# Patient Record
Sex: Male | Born: 1955 | Race: White | Hispanic: No | Marital: Married | Smoking: Former smoker
Health system: Southern US, Community
[De-identification: ages and names within clinical notes are randomized; demographics above are authoritative.]

## PROBLEM LIST (undated history)

## (undated) DIAGNOSIS — E785 Hyperlipidemia, unspecified: Secondary | ICD-10-CM

## (undated) DIAGNOSIS — T7840XA Allergy, unspecified, initial encounter: Secondary | ICD-10-CM

## (undated) DIAGNOSIS — J189 Pneumonia, unspecified organism: Secondary | ICD-10-CM

## (undated) DIAGNOSIS — I10 Essential (primary) hypertension: Secondary | ICD-10-CM

## (undated) DIAGNOSIS — K219 Gastro-esophageal reflux disease without esophagitis: Secondary | ICD-10-CM

## (undated) DIAGNOSIS — M1990 Unspecified osteoarthritis, unspecified site: Secondary | ICD-10-CM

## (undated) HISTORY — PX: BACK SURGERY: SHX140

## (undated) HISTORY — PX: SPINE SURGERY: SHX786

## (undated) HISTORY — DX: Allergy, unspecified, initial encounter: T78.40XA

## (undated) HISTORY — DX: Unspecified osteoarthritis, unspecified site: M19.90

---

## 2003-08-16 ENCOUNTER — Emergency Department (HOSPITAL_COMMUNITY): Admission: EM | Admit: 2003-08-16 | Discharge: 2003-08-17 | Payer: Self-pay | Admitting: Emergency Medicine

## 2003-08-28 ENCOUNTER — Inpatient Hospital Stay (HOSPITAL_COMMUNITY): Admission: RE | Admit: 2003-08-28 | Discharge: 2003-08-31 | Payer: Self-pay | Admitting: Neurosurgery

## 2004-08-29 ENCOUNTER — Emergency Department (HOSPITAL_COMMUNITY): Admission: EM | Admit: 2004-08-29 | Discharge: 2004-08-29 | Payer: Self-pay | Admitting: Emergency Medicine

## 2006-06-17 ENCOUNTER — Emergency Department (HOSPITAL_COMMUNITY): Admission: EM | Admit: 2006-06-17 | Discharge: 2006-06-17 | Payer: Self-pay | Admitting: Family Medicine

## 2009-03-07 ENCOUNTER — Emergency Department (HOSPITAL_COMMUNITY): Admission: EM | Admit: 2009-03-07 | Discharge: 2009-03-07 | Payer: Self-pay | Admitting: Family Medicine

## 2009-04-19 ENCOUNTER — Encounter: Admission: RE | Admit: 2009-04-19 | Discharge: 2009-04-19 | Payer: Self-pay | Admitting: Neurosurgery

## 2010-01-25 ENCOUNTER — Emergency Department (HOSPITAL_BASED_OUTPATIENT_CLINIC_OR_DEPARTMENT_OTHER): Admission: EM | Admit: 2010-01-25 | Discharge: 2010-01-25 | Payer: Self-pay | Admitting: Emergency Medicine

## 2010-07-12 ENCOUNTER — Emergency Department (HOSPITAL_BASED_OUTPATIENT_CLINIC_OR_DEPARTMENT_OTHER): Admission: EM | Admit: 2010-07-12 | Discharge: 2010-07-12 | Payer: Self-pay | Admitting: Emergency Medicine

## 2010-10-22 LAB — COMPREHENSIVE METABOLIC PANEL
ALT: 23 U/L (ref 0–53)
AST: 23 U/L (ref 0–37)
Albumin: 4.3 g/dL (ref 3.5–5.2)
Alkaline Phosphatase: 78 U/L (ref 39–117)
BUN: 16 mg/dL (ref 6–23)
CO2: 23 mEq/L (ref 19–32)
Calcium: 9.1 mg/dL (ref 8.4–10.5)
Chloride: 102 mEq/L (ref 96–112)
Creatinine, Ser: 1 mg/dL (ref 0.4–1.5)
GFR calc Af Amer: >60 mL/min (ref >60)
GFR calc non Af Amer: >60 mL/min (ref >60)
Glucose, Bld: 156 mg/dL — ABNORMAL HIGH (ref 70–99)
Potassium: 3.6 mEq/L (ref 3.5–5.1)
Sodium: 140 mEq/L (ref 135–145)
Total Bilirubin: 0.5 mg/dL (ref 0.3–1.2)
Total Protein: 6.9 g/dL (ref 6.0–8.3)

## 2010-10-22 LAB — CBC
HCT: 43.3 % (ref 39.0–52.0)
Hemoglobin: 14.9 g/dL (ref 13.0–17.0)
MCH: 30 pg (ref 26.0–34.0)
MCHC: 34.3 g/dL (ref 30.0–36.0)
MCV: 87.2 fL (ref 78.0–100.0)
Platelets: 223 10*3/uL (ref 150–400)
RBC: 4.96 MIL/uL (ref 4.22–5.81)
RDW: 12.5 % (ref 11.5–15.5)
WBC: 9.3 10*3/uL (ref 4.0–10.5)

## 2010-10-22 LAB — DIFFERENTIAL
Basophils Absolute: 0.2 10*3/uL — ABNORMAL HIGH (ref 0.0–0.1)
Basophils Relative: 2 % — ABNORMAL HIGH (ref 0–1)
Eosinophils Absolute: 0.2 10*3/uL (ref 0.0–0.7)
Eosinophils Relative: 2 % (ref 0–5)
Lymphocytes Relative: 27 % (ref 12–46)
Lymphs Abs: 2.5 10*3/uL (ref 0.7–4.0)
Monocytes Absolute: 0.5 10*3/uL (ref 0.1–1.0)
Monocytes Relative: 5 % (ref 3–12)
Neutro Abs: 5.9 10*3/uL (ref 1.7–7.7)
Neutrophils Relative %: 64 % (ref 43–77)

## 2010-12-27 NOTE — Op Note (Signed)
NAMEMORAD, TAL                         ACCOUNT NO.:  [id number]   MEDICAL RECORD NO.:  [id number]                   PATIENT TYPE:  INP   LOCATION:  3172                                 FACILITY:  [facility]   PHYSICIAN:  Hewitt Shorts, M.D.            DATE OF BIRTH:  05-17-1956   DATE OF PROCEDURE:  08/28/2003  DATE OF DISCHARGE:                                 OPERATIVE REPORT   PREOPERATIVE DIAGNOSIS:  L3-4 lumbar disk herniation, lumbar spondylosis,  lumbar degenerative disk disease and lumbar radiculopathy.   POSTOPERATIVE DIAGNOSIS:  L3-4 lumbar disk herniation, lumbar spondylosis,  lumbar degenerative disk disease and lumbar radiculopathy.   OPERATION PERFORMED:  L3 Gill procedure, L3-4 posterior lumbar interbody  fusion with Globus PEEK cages with Vitoss with bone marrow aspirate and L3-4  posterolateral arthrodesis with 90D posterior instrumentation and Vitoss  with bone marrow aspirate.   SURGEON:  Hewitt Shorts, M.D.   ASSISTANT:  Cristi Loron, M.D.   ANESTHESIA:  General endotracheal.   INDICATIONS FOR PROCEDURE:  The patient is a 55 year old man who presented  with bilateral lumbar radiculopathy, left worse than right, who had large  circumferential disk protrusion with central disk herniation at the L3-4  level with intractable pain and distal weakness left worse than right.  Decision was made to proceed with decompression and arthrodesis.   DESCRIPTION OF PROCEDURE:  The patient was brought to the operating room and  placed under general endotracheal anesthesia.  Patient was turned to prone  position, lumbar region was prepped with Betadine soap and solution and  draped in sterile fashion.  The midline was infiltrated with local  anesthetic with epinephrine.  An x-ray was taken to localize the L3-4 level  and a midline incision was made and carried down through the subcutaneous  tissue.  Bipolar cautery and electrocautery were used to  maintain  hemostasis.  Dissection was carried down to the lumbar fascia which was  incised bilaterally.  The paraspinal muscles were dissected from the spinous  processes and lamina in subperiosteal fashion.  The L3-4 interlaminar space  and L3 spinous process were identified with another localizing x-ray and  then bilateral L3 laminectomy and facetectomy was performed.  Foraminotomy  was performed for the L4 nerve root bilaterally and the L3 nerve root was  decompressed at it exited into the L3-4 foramen bilaterally.  The ligamentum  flavum had been removed, exposing the thecal sac and nerve roots for the  decompression as described.  We identified the L3-4 disk space which was  incised bilaterally and a thorough diskectomy was performed using a variety  of pituitary rongeurs and microcurets.  The end plates of the vertebral  bodies were cleaned of cartilaginous surfaces and the end plates were  prepared for arthrodesis.  Using various trial spacers, we selected 9 x 8 mm  interbody implant.  We then went ahead and probed the pedicles of  L4 and  aspirated bone marrow aspirate which was injected over 10 mL of Vitoss  strip.  It was allowed to saturate this strip and then the interbody  implants were packed with Vitoss with bone marrow aspirate and then  carefully positioned in the intervertebral disk space and countersunk.  We  then took additional Vitoss with bone marrow aspirate and packed it around  the cages.  It was tamped in position in the intervertebral disk space.  The  C-arm fluoroscopy unit was then brought into the field.  We identified the  pedicle entry sites at L3 and the pedicles were probed.  Each of the four  pedicles was examined with a ball probe.  Note the cut outs were found.  They were all tapped with a 5.25 tap and then a 6.75 x 45 mm screw was  placed at each pedicle.  We then selected a 4 mm rod.  It was positioned  within the screw heads and secured with a locking  cap.  Final tightening was  done __________  We did do general under rod compression bilaterally,  immediately prior to tightening a second locking cap.  The transverse  processes of L3 and L4 had been exposed through an initial exposure.  They  were decorticated and then the additional Vitoss with bone marrow aspirate  was packed over the transverse processes and into the intertransverse  gutter. The wound was irrigated throughout the procedure with saline  solution and bacitracin solution.  A final irrigation was done prior to  closure.  Good hemostasis was established.  We did use the Cell Saver during  the surgery but only 250 mL of blood were lost and was insufficient to  retrieve any from the Cell Saver.  Sponge and instrument counts were  correct.  The wound was closed in multiple layers.  The deep fascia was  closed with interrupted undyed 1 Vicryl suture.  The subcutaneous and  subcuticular layer were closed with interrupted inverted 2-0 undyed Vicryl  sutures and the skin was reapproximated with Dermabond.  The patient  tolerated the procedure well.  Following surgery, he returned back to supine  position to be reversed from anesthetic, extubated and transferred to  recovery room for further care.                                               Hewitt Shorts, M.D.    RWN/MEDQ  D:  08/28/2003  T:  08/28/2003  Job:  [id number]

## 2010-12-27 NOTE — H&P (Signed)
Steven Cook, Steven Cook                         ACCOUNT NO.:  [id number]   MEDICAL RECORD NO.:  [id number]                   PATIENT TYPE:  INP   LOCATION:  2899                                 FACILITY:  [facility]   PHYSICIAN:  Hewitt Shorts, M.D.            DATE OF BIRTH:  1955-09-07   DATE OF ADMISSION:  08/28/2003  DATE OF DISCHARGE:                                HISTORY & PHYSICAL   HISTORY OF PRESENT ILLNESS:  The patient is a 55 year old right-handed white  male who was evaluated for a lumbar disk herniation with resulting lumbar  radiculopathy.  He has had some occasional upper left buttock pain for the  past couple of years and occasionally the left lower extremity would feel  weak.  This past summer he had an episode when he was helping his brother  put up some lighting, he felt a strain in his low back when he extended  backwards.  His most recent episode began 12 days ago without any particular  cause.  He had sudden onset of pain and spasm in the upper left buttock and  across the low back, but the episode was worse than usual.  He did have some  pain in the lateral aspect of the left thigh, but no numbness or  paresthesias.  The patient was treated with a prednisone Dosepak which he  only took for one day because it did not agree with him.  He has been off  from work and resting at home at a sedentary level of activity, and the pain  has eased, but he still is having pain in the upper left buttock and he is  very tentative about his movements.  His wife feels that he significantly is  limiting his activities, but he feels that he is just trying to avoid  aggravating the pain and discomfort.  The patient did not describe any  radicular numbness or paresthesias, he did not describe any specific  weakness.   X-rays and MRI revealed degenerative disk disease and spondylosis of the  lumbar spine, desecration of the disk at L3-4, L4-5, and L5-S1, no  significant finding of  central disk protrusion at L3-4 with circumferential  disk bulging with compression of the ventral aspect of the thecal sac and  bilateral foraminal narrowing due to circumferential disk protrusion.  There  is no disk herniation nor significant neural compression at other levels.   We discussed the options of further care with the patient, including  continued symptomatic treatment versus a more limited surgical procedure  versus a more definitive surgical procedures, presumably an L3 Gill  procedure and a bilateral L3-4 posterior lumbar interbody fusion and  posterior lateral arthrodesis with interbody implants and posterior  instrumentation with bone grafts.  After discussing each of these options  thoroughly, the patient wished to proceed with a more definitive procedure,  and is admitted for such.   PAST MEDICAL  HISTORY:  1. History of gastroesophageal reflux disease for which he takes Prilosec     daily.  2. History of hypercholesterolemia which he is treating with diet     modification.  No history of hypertension, myocardial infarction, cancer,     stroke, diabetes, or lung disease.   PAST SURGICAL HISTORY:  Septoplasty and sinus surgery.   ALLERGIES:  He reports an allergy to ASPIRIN which causes watery eyes and a  runny nose.   CURRENT MEDICATIONS:  Prilosec.   FAMILY HISTORY:  His father died at age 89 of a brain tumor.  Mother is in  good health at age 14 with heart disease.  There is a family history of  hypertension, diabetes, and back surgery.   SOCIAL HISTORY:  The patient is married.  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  He smokes 1/2 pack a day, he has been  smoking for nearly 30 years.  He does not drink alcoholic beverages.  He  denies history of substance abuse.   REVIEW OF SYSTEMS:  Notable for those symptoms described in the history of  present illness and past medical history, otherwise unremarkable.   PHYSICAL  EXAMINATION:  GENERAL:  The patient is a well-developed, well-  nourished white male in no acute distress, but he is clearly very tenative  in his movements to avoid exacerbating his discomfort.  VITAL SIGNS:  Temperature 97.6, pulse 102, blood pressure 136/94,  respiratory rate 18, height 5 feet 9 inches, weight 173 pounds.  LUNGS:  Clear to auscultation.  He has symmetrical respiratory excursion.  HEART:  Regular rate and rhythm with S1 and S2.  There is no murmur.  ABDOMEN:  Soft, nontender, bowel sounds are present.  EXTREMITIES:  Mild to moderate clubbing.  There is no cyanosis or edema.  MUSCULOSKELETAL:  Mild tenderness to palpation of the lumbar spine.  There  is point tenderness over the lumbar spinous process.  Mobility is  significantly limited.  He is limited in forward flexion to about 30 degrees  due to pain.  He is limited in extension as well.  Straight leg raising is  positive on the left to about 20 or 30 degrees, and he has positive cross  straight leg raise on the right at about 20 to 30 degrees with pain  occurring on the left side.  NEUROLOGIC:  5/5 strength in the iliopsoas, quadriceps, dorsiflexion, and  plantar flexion.  However, the left extensor hallicis longus is 4+/5 and the  right extensor hallicis longus is 4+ to 5/5.  Sensation is intact to  pinprick through the distal lower extremities.  Reflexes are 2 at the  biceps, 1 to 2 ___________, symmetrical bilaterally.  Toes are downgoing  bilaterally.  His gait and stance are both somewhat tentative due to  attempting to avoid re-exacerbating his radicular pain.   IMPRESSION:  Disabling left lumbar radiculopathy secondary to a L3-4 disk  herniation superimposed upon underlying degenerative disk disease and  spondylosis with bilateral mild extensor hallices longus weakness, left  worse than right.   PLAN:  The patient will be admitted for a L3 Gill procedure with bilateral L3-4 posterior lumbar interbody fusion  with interbody implants and posterior  lateral arthrodesis with posterior instrumentation and bone graft.   We did discuss alternatives to surgery and alternative surgical procedures.  We discussed the nature of each of these procedures.  We discussed the  ____________, limitations postoperatively, need for postoperative  immobilization and lumbar corset, the plan to use a Cell Saver during such a  surgery, and risks of surgery, including risk of infection, bleeding,  possible need for transfusion, risk of nerve root dysfunction with pain,  numbness, weakness, or paresthesias, the risk of dural tear and CSF leakage,  the possibility for further surgery, the risk of failure of the arthrodesis,  particularly in light of his smoking history, possible need for further  surgery, and anesthetic risks of myocardial infarction, stroke, pneumonia,  and death, all of which again are increased due to both his smoking as well  as hypercholesterolemia.   I did speak with the patient and his wife at length about the importance of  him quitting smoking to enhance the likelihood of a successful fusion and  successful outcome to surgery.  Understanding all of this, the patient is  admitted for such.                                                Hewitt Shorts, M.D.    RWN/MEDQ  D:  08/28/2003  T:  08/28/2003  Job:  [id number]

## 2010-12-27 NOTE — Discharge Summary (Signed)
[name], Steven Cook                         ACCOUNT NO.:  [id number]   MEDICAL RECORD NO.:  [id number]                   PATIENT TYPE:  INP   LOCATION:  3020                                 FACILITY:  [facility]   PHYSICIAN:  Hewitt Shorts, M.D.            DATE OF BIRTH:  10-17-1955   DATE OF ADMISSION:  08/28/2003  DATE OF DISCHARGE:  08/31/2003                                 DISCHARGE SUMMARY   HISTORY AND PHYSICAL:  Patient is a 55 year old man who presented with acute  lumbar radiculopathy secondary to lumbar disk herniation.  He had a central  disk herniation at the L3-4 level with disk protrusion laterally within the  foramen bilaterally as well.   PHYSICAL EXAMINATION:  GENERAL:  Unremarkable.  NEUROLOGIC:  Weakness of the extensor hallucis longus.   HOSPITAL COURSE:  Patient was admitted.  Underwent an L3 Gill procedure and  L3-4 posterior lumbar interbody fusion and posterolateral arthrodesis with  interbody cages and posterior instrumentation and bone graft.  He has done  well following surgery.  His wound is healing well.  He is afebrile.  He is  up and ambulating.  He is being discharged to home with instructions  regarding wound care and activities.   A discharge prescription was given for Percocet 1-2 tablets every 4-6 hours  p.r.n. pain with 50 tablets prescribed.  No refills.   DISCHARGE DIAGNOSES:  1. Lumbar disk herniation.  2. Lumbar spondylosis.  3. Lumbar degenerative disk disease.  4. Lumbar radiculopathy.                                                Hewitt Shorts, M.D.    RWN/MEDQ  D:  08/31/2003  T:  09/01/2003  Job:  [id number]

## 2011-03-03 ENCOUNTER — Other Ambulatory Visit (HOSPITAL_COMMUNITY): Payer: Self-pay | Admitting: Neurosurgery

## 2011-03-03 DIAGNOSIS — M542 Cervicalgia: Secondary | ICD-10-CM

## 2011-03-26 ENCOUNTER — Ambulatory Visit (HOSPITAL_COMMUNITY)
Admission: RE | Admit: 2011-03-26 | Discharge: 2011-03-26 | Disposition: A | Discharge status: Home / Self Care | Payer: BC Managed Care – PPO | Source: Ambulatory Visit | Attending: Neurosurgery | Admitting: Neurosurgery

## 2011-03-26 DIAGNOSIS — M47812 Spondylosis without myelopathy or radiculopathy, cervical region: Secondary | ICD-10-CM | POA: Insufficient documentation

## 2011-03-26 DIAGNOSIS — M542 Cervicalgia: Secondary | ICD-10-CM

## 2011-04-10 ENCOUNTER — Other Ambulatory Visit: Payer: Self-pay | Admitting: Neurosurgery

## 2011-04-10 DIAGNOSIS — M479 Spondylosis, unspecified: Secondary | ICD-10-CM

## 2011-04-10 DIAGNOSIS — M5020 Other cervical disc displacement, unspecified cervical region: Secondary | ICD-10-CM

## 2011-04-10 DIAGNOSIS — IMO0002 Reserved for concepts with insufficient information to code with codable children: Secondary | ICD-10-CM

## 2011-04-10 DIAGNOSIS — M542 Cervicalgia: Secondary | ICD-10-CM

## 2011-04-14 ENCOUNTER — Encounter: Payer: Self-pay | Admitting: *Deleted

## 2011-04-14 ENCOUNTER — Emergency Department (INDEPENDENT_AMBULATORY_CARE_PROVIDER_SITE_OTHER): Payer: BC Managed Care – PPO

## 2011-04-14 ENCOUNTER — Emergency Department (HOSPITAL_BASED_OUTPATIENT_CLINIC_OR_DEPARTMENT_OTHER)
Admission: EM | Admit: 2011-04-14 | Discharge: 2011-04-14 | Disposition: A | Discharge status: Home / Self Care | Payer: BC Managed Care – PPO | Attending: Emergency Medicine | Admitting: Emergency Medicine

## 2011-04-14 DIAGNOSIS — J3489 Other specified disorders of nose and nasal sinuses: Secondary | ICD-10-CM | POA: Insufficient documentation

## 2011-04-14 DIAGNOSIS — R0989 Other specified symptoms and signs involving the circulatory and respiratory systems: Secondary | ICD-10-CM

## 2011-04-14 DIAGNOSIS — J40 Bronchitis, not specified as acute or chronic: Secondary | ICD-10-CM | POA: Insufficient documentation

## 2011-04-14 DIAGNOSIS — R059 Cough, unspecified: Secondary | ICD-10-CM | POA: Insufficient documentation

## 2011-04-14 DIAGNOSIS — J329 Chronic sinusitis, unspecified: Secondary | ICD-10-CM | POA: Insufficient documentation

## 2011-04-14 DIAGNOSIS — R05 Cough: Secondary | ICD-10-CM

## 2011-04-14 MED ORDER — AMOXICILLIN 500 MG PO CAPS: 500.0000 mg | ORAL_CAPSULE | Freq: Three times a day (TID) | ORAL | Status: AC

## 2011-04-14 NOTE — ED Notes (Signed)
Pt. Just returned from x-ray 

## 2011-04-14 NOTE — ED Notes (Signed)
Family at bedside. 

## 2011-04-14 NOTE — ED Notes (Signed)
Pt. Reports pain all over and generalized weakness with cough and stuffy nose.

## 2011-04-14 NOTE — ED Provider Notes (Signed)
History     CSN: [id number] Arrival date & time: 04/14/2011  1:19 PM  Chief Complaint  Patient presents with  . Sinusitis   Patient is a 55 y.o. male presenting with sinusitis. The history is provided by the patient. No language interpreter was used.  Sinusitis  This is a new problem. The current episode started more than 2 days ago. The problem has not changed since onset.There has been no fever. The pain is moderate. The pain has been constant since onset. Associated symptoms include sinus pressure and cough. Pertinent negatives include no chills and no sore throat. Treatments tried: otc cold medications. The treatment provided no relief.    History reviewed. No pertinent past medical history.  Past Surgical History  Procedure Date  . Back surgery     No family history on file.  History  Substance Use Topics  . Smoking status: Former Games developer  . Smokeless tobacco: Current User  . Alcohol Use: No      Review of Systems  Constitutional: Negative for chills.  HENT: Positive for sinus pressure. Negative for sore throat.   Respiratory: Positive for cough.   All other systems reviewed and are negative.    Physical Exam  BP 128/86  Pulse 90  Temp(Src) 98.3 F (36.8 C) (Oral)  Resp 20  SpO2 97%  Physical Exam  Nursing note and vitals reviewed. Constitutional: He is oriented to person, place, and time. He appears well-developed and well-nourished.  HENT:  Head: Normocephalic.  Right Ear: External ear normal.  Left Ear: External ear normal.  Nose: Rhinorrhea present. Right sinus exhibits frontal sinus tenderness. Left sinus exhibits frontal sinus tenderness.  Cardiovascular: Normal rate and regular rhythm.   Pulmonary/Chest: Breath sounds normal.  Abdominal: Soft. Bowel sounds are normal.  Musculoskeletal: Normal range of motion.  Neurological: He is alert and oriented to person, place, and time.  Skin: Skin is warm and dry.    ED Course  Procedures  Dg Chest 2  View  04/14/2011  *RADIOLOGY REPORT*  Clinical Data: 55 year old male with cough and congestion.  CHEST - 2 VIEW  Comparison: None  Findings: The cardiomediastinal silhouette is unremarkable. Mild peribronchial thickening is present. There is no evidence of focal airspace disease, pulmonary edema, pulmonary nodule/mass, pleural effusion, or pneumothorax. No acute bony abnormalities are identified.  IMPRESSION: Mild peribronchial thickening without focal pneumonia.  This may be a reflection of bronchitis or asthma.  Original Report Authenticated By: Rosendo Gros, M.D.   r   MDM Will treat for sinusitis and bronchitis      Teressa Lower, NP 04/14/11 1633  Teressa Lower, NP 04/14/11 [id number]

## 2011-04-14 NOTE — ED Notes (Signed)
Pt. In no distress and has been reassured of the wait for EDP.  Pt. Complaining and states "each time I come here I am seen with in 5 mins." Girlfriend of Pt. Very angry and states she has to be in bed by 5pm.  Explained to Pt. And girlfriend that we are working as hard and as fast as we can.  Offered any needs and neither of them have any.

## 2011-04-14 NOTE — ED Provider Notes (Signed)
Medical screening examination/treatment/procedure(s) were performed by non-physician practitioner and as supervising physician I was immediately available for consultation/collaboration.   Geoffery Lyons, MD 04/14/11 2139

## 2011-04-15 ENCOUNTER — Inpatient Hospital Stay: Admission: RE | Admit: 2011-04-15 | Payer: BC Managed Care – PPO | Source: Ambulatory Visit

## 2011-04-18 ENCOUNTER — Other Ambulatory Visit: Payer: Self-pay | Admitting: Neurosurgery

## 2011-04-18 ENCOUNTER — Ambulatory Visit
Admission: RE | Admit: 2011-04-18 | Discharge: 2011-04-18 | Disposition: A | Discharge status: Home / Self Care | Payer: BC Managed Care – PPO | Source: Ambulatory Visit | Attending: Neurosurgery | Admitting: Neurosurgery

## 2011-04-18 DIAGNOSIS — M542 Cervicalgia: Secondary | ICD-10-CM

## 2011-04-18 DIAGNOSIS — M479 Spondylosis, unspecified: Secondary | ICD-10-CM

## 2011-04-18 DIAGNOSIS — M5020 Other cervical disc displacement, unspecified cervical region: Secondary | ICD-10-CM

## 2011-04-18 DIAGNOSIS — IMO0002 Reserved for concepts with insufficient information to code with codable children: Secondary | ICD-10-CM

## 2011-04-18 MED ORDER — IOHEXOL 300 MG/ML  SOLN
1.0000 mL | Freq: Once | INTRAMUSCULAR | Status: AC | PRN
  Administered 2011-04-18: 1 mL via INTRA_ARTICULAR

## 2011-04-18 MED ORDER — DEXAMETHASONE SODIUM PHOSPHATE 4 MG/ML IJ SOLN
0.3000 mg | Freq: Once | INTRAMUSCULAR | Status: AC
  Administered 2011-04-18: 0.32 mg via INTRAVENOUS

## 2012-01-19 ENCOUNTER — Emergency Department (HOSPITAL_BASED_OUTPATIENT_CLINIC_OR_DEPARTMENT_OTHER)
Admission: EM | Admit: 2012-01-19 | Discharge: 2012-01-19 | Disposition: A | Discharge status: Home / Self Care | Payer: BC Managed Care – PPO | Attending: Emergency Medicine | Admitting: Emergency Medicine

## 2012-01-19 ENCOUNTER — Encounter (HOSPITAL_BASED_OUTPATIENT_CLINIC_OR_DEPARTMENT_OTHER): Payer: Self-pay | Admitting: *Deleted

## 2012-01-19 DIAGNOSIS — I10 Essential (primary) hypertension: Secondary | ICD-10-CM | POA: Insufficient documentation

## 2012-01-19 DIAGNOSIS — K1379 Other lesions of oral mucosa: Secondary | ICD-10-CM

## 2012-01-19 DIAGNOSIS — Z87891 Personal history of nicotine dependence: Secondary | ICD-10-CM | POA: Insufficient documentation

## 2012-01-19 DIAGNOSIS — K122 Cellulitis and abscess of mouth: Secondary | ICD-10-CM | POA: Insufficient documentation

## 2012-01-19 HISTORY — DX: Essential (primary) hypertension: I10

## 2012-01-19 MED ORDER — FAMOTIDINE 20 MG PO TABS: 20.0000 mg | ORAL_TABLET | Freq: Two times a day (BID) | ORAL | Status: DC

## 2012-01-19 MED ORDER — DIPHENHYDRAMINE HCL 25 MG PO CAPS
25.0000 mg | ORAL_CAPSULE | Freq: Once | ORAL | Status: AC
  Administered 2012-01-19: 25 mg via ORAL
  Filled 2012-01-19: qty 1

## 2012-01-19 MED ORDER — DEXAMETHASONE SODIUM PHOSPHATE 10 MG/ML IJ SOLN
10.0000 mg | Freq: Once | INTRAMUSCULAR | Status: AC
  Administered 2012-01-19: 10 mg via INTRAMUSCULAR
  Filled 2012-01-19: qty 1

## 2012-01-19 MED ORDER — FAMOTIDINE 20 MG PO TABS
20.0000 mg | ORAL_TABLET | Freq: Once | ORAL | Status: AC
  Administered 2012-01-19: 20 mg via ORAL
  Filled 2012-01-19: qty 1

## 2012-01-19 NOTE — ED Notes (Signed)
Pt amb to room 8 with quick steady gait in nad. Pt states he awakened this am to feeling as if his uvula is swollen. Pt states "it is irritating". Pt denies any sob, rash, sore throat, fevers or any other c/o.

## 2012-01-19 NOTE — ED Provider Notes (Signed)
History     CSN: [id number]  Arrival date & time 01/19/12  6962   First MD Initiated Contact with Patient 01/19/12 [phone]      Chief Complaint  Patient presents with  . Oral Swelling    (Consider location/radiation/quality/duration/timing/severity/associated sxs/prior treatment) HPI Comments: Patient presents with complaint of uvula swelling since this morning.  He states he woke up this morning he can feel that it felt slightly irritated.  Over the course of the morning he is felt that it's enlarged to the point that it's pain down into his throat and causing him more irritation and difficulty.  He can control his secretions.  He can speak and has no shortness of breath.  He denies any other rash or pruritic symptoms.  He denies any new medications or foods.  Patient does note he's had similar symptoms in the past but has not been able to determine a cause.  Patient otherwise feels completely well and has no URI symptoms either.  The history is provided by the patient.    Past Medical History  Diagnosis Date  . Hypertension     Past Surgical History  Procedure Date  . Back surgery     History reviewed. No pertinent family history.  History  Substance Use Topics  . Smoking status: Former Games developer  . Smokeless tobacco: Current User  . Alcohol Use: No      Review of Systems  Constitutional: Negative.  Negative for fever and chills.  Eyes: Negative.   Respiratory: Negative.  Negative for cough and shortness of breath.   Cardiovascular: Negative.  Negative for chest pain.  Gastrointestinal: Negative.  Negative for nausea, vomiting and abdominal pain.  Genitourinary: Negative.   Musculoskeletal: Negative.  Negative for back pain.  Skin: Negative.  Negative for color change and rash.  Hematological: Negative.  Negative for adenopathy.  Psychiatric/Behavioral: Negative.  Negative for confusion.  All other systems reviewed and are negative.    Allergies  Nsaids  Home  Medications   Current Outpatient Rx  Name Route Sig Dispense Refill  . ESOMEPRAZOLE MAGNESIUM 20 MG PO CPDR Oral Take 20 mg by mouth daily before breakfast.    . LISINOPRIL-HYDROCHLOROTHIAZIDE 10-12.5 MG PO TABS Oral Take 1 tablet by mouth daily.    Marland Kitchen SIMVASTATIN 20 MG PO TABS Oral Take 20 mg by mouth every evening.    Marland Kitchen FAMOTIDINE 20 MG PO TABS Oral Take 1 tablet (20 mg total) by mouth 2 (two) times daily. 20 tablet 0    BP 139/82  Pulse 80  Temp(Src) 98 F (36.7 C) (Oral)  Resp 18  SpO2 98%  Physical Exam  Nursing note and vitals reviewed. Constitutional: He is oriented to person, place, and time. He appears well-developed and well-nourished.  Non-toxic appearance. He does not have a sickly appearance.  HENT:  Head: Normocephalic and atraumatic.  Mouth/Throat: No oropharyngeal exudate.       Patient does have some mild uvular swelling on exam.  No exudates present.  Uvula is midline.  No peritonsillar swelling or erythema is noted.  No sublingual swelling.  No cervical adenopathy.  No submandibular lymphadenopathy.  Eyes: Conjunctivae, EOM and lids are normal. Pupils are equal, round, and reactive to light.  Neck: Trachea normal, normal range of motion and full passive range of motion without pain. Neck supple.  Cardiovascular: Normal rate, regular rhythm and normal heart sounds.   Pulmonary/Chest: Effort normal and breath sounds normal. No respiratory distress.  Abdominal: Normal appearance. There is  no CVA tenderness.  Musculoskeletal: Normal range of motion.  Lymphadenopathy:    He has no cervical adenopathy.  Neurological: He is alert and oriented to person, place, and time. He has normal strength.  Skin: Skin is warm, dry and intact. No rash noted.  Psychiatric: He has a normal mood and affect. His behavior is normal. Judgment and thought content normal.    ED Course  Procedures (including critical care time)  Labs Reviewed - No data to display No results  found.   1. Uvular edema       MDM  Patient presents with possible mild allergic reaction.  There is no clear etiologies the patient denies any new medications or foods.  Patient is in no respiratory distress and is able to speak without difficulty.  He is no difficulty controlling his secretions.  I will treat the patient for allergic reaction with Benadryl, Pepcid and Decadron.  Patient has been advised that should the symptoms worsen or not improve he should be reevaluated.  He desires to go back to work today and does not feel this is worsening rapidly to warrant further observation in the emergency department at this time.        Nat Christen, MD 01/19/12 [phone]

## 2012-01-19 NOTE — Discharge Instructions (Signed)
Please continue to take Benadryl for the next 3-4 days until this improves.  Continues to take Pepcid twice a day as well.  Please return to the emergency department if you have increased difficulty breathing or feel that there is increased swelling in your uvula or mouth.  Allergic Reaction, Mild to Moderate Allergies may happen from anything your body is sensitive to. This may be food, medications, pollens, chemicals, and nearly anything around you in everyday life that produces allergens. An allergen is anything that causes an allergy producing substance. Allergens cause your body to release allergic antibodies. Through a chain of events, they cause a release of histamine into the blood stream. Histamines are meant to protect you, but they also cause your discomfort. This is why antihistamines are often used for allergies. Heredity is often a factor in causing allergic reactions. This means you may have some of the same allergies as your parents. Allergies happen in all age groups. You may have some idea of what caused your reaction. There are many allergens around [place]. It may be difficult to know what caused your reaction. If this is a first time event, it may never happen again. Allergies cannot be cured but can be controlled with medications. SYMPTOMS  You may get some or all of the following problems from allergies.  Swelling and itching in and around the mouth.   Tearing, itchy eyes.   Nasal congestion and runny nose.   Sneezing and coughing.   An itchy red rash or hives.   Vomiting or diarrhea.   Difficulty breathing.  Seasonal allergies occur in all age groups. They are seasonal because they usually occur during the same season every year. They may be a reaction to molds, grass pollens, or tree pollens. Other causes of allergies are house dust mite allergens, pet dander and mold spores. These are just a common few of the thousands of allergens around [place]. All of the symptoms listed above  happen when you come in contact with pollens and other allergens. Seasonal allergies are usually not life threatening. They are generally more of a nuisance that can often be handled using medications. Hay fever is a combination of all or some of the above listed allergy problems. It may often be treated with simple over-the-counter medications such as diphenhydramine. Take medication as directed. Check with your caregiver or package insert for child dosages. TREATMENT AND HOME CARE INSTRUCTIONS If hives or rash are present:  Take medications as directed.   You may use an over-the-counter antihistamine (diphenhydramine) for hives and itching as needed. Do not drive or drink alcohol until medications used to treat the reaction have worn off. Antihistamines tend to make people sleepy.   Apply cold cloths (compresses) to the skin or take baths in cool water. This will help itching. Avoid hot baths or showers. Heat will make a rash and itching worse.   If your allergies persist and become more severe, and over the counter medications are not effective, there are many new medications your caretaker can prescribe. Immunotherapy or desensitizing injections can be used if all else fails. Follow up with your caregiver if problems continue.  SEEK MEDICAL CARE IF:   Your allergies are becoming progressively more troublesome.   You suspect a food allergy. Symptoms generally happen within 30 minutes of eating a food.   Your symptoms have not gone away within 2 days or are getting worse.   You develop new symptoms.   You want to retest yourself or  your child with a food or drink you think causes an allergic reaction. Never test yourself or your child of a suspected allergy without being under the watchful eye of your caregivers. A second exposure to an allergen may be life-threatening.  SEEK IMMEDIATE MEDICAL CARE IF:  You develop difficulty breathing or wheezing, or have a tight feeling in your chest or  throat.   You develop a swollen mouth, hives, swelling, or itching all over your body.  A severe reaction with any of the above problems should be considered life-threatening. If you suddenly develop difficulty breathing call for local emergency medical help. THIS IS AN EMERGENCY. MAKE SURE YOU:   Understand these instructions.   Will watch your condition.   Will get help right away if you are not doing well or get worse.  Document Released: 05/25/2007 Document Revised: 07/17/2011 Document Reviewed: 05/25/2007 [facility] Patient Information 2012 [place], [place].

## 2012-07-18 ENCOUNTER — Emergency Department (HOSPITAL_BASED_OUTPATIENT_CLINIC_OR_DEPARTMENT_OTHER)
Admission: EM | Admit: 2012-07-18 | Discharge: 2012-07-18 | Disposition: A | Discharge status: Home / Self Care | Payer: BC Managed Care – PPO | Attending: Emergency Medicine | Admitting: Emergency Medicine

## 2012-07-18 ENCOUNTER — Emergency Department (HOSPITAL_BASED_OUTPATIENT_CLINIC_OR_DEPARTMENT_OTHER): Payer: BC Managed Care – PPO

## 2012-07-18 ENCOUNTER — Encounter (HOSPITAL_BASED_OUTPATIENT_CLINIC_OR_DEPARTMENT_OTHER): Payer: Self-pay

## 2012-07-18 DIAGNOSIS — Z8701 Personal history of pneumonia (recurrent): Secondary | ICD-10-CM | POA: Insufficient documentation

## 2012-07-18 DIAGNOSIS — J069 Acute upper respiratory infection, unspecified: Secondary | ICD-10-CM | POA: Insufficient documentation

## 2012-07-18 DIAGNOSIS — E785 Hyperlipidemia, unspecified: Secondary | ICD-10-CM | POA: Insufficient documentation

## 2012-07-18 DIAGNOSIS — Z79899 Other long term (current) drug therapy: Secondary | ICD-10-CM | POA: Insufficient documentation

## 2012-07-18 DIAGNOSIS — F17200 Nicotine dependence, unspecified, uncomplicated: Secondary | ICD-10-CM | POA: Insufficient documentation

## 2012-07-18 DIAGNOSIS — R0602 Shortness of breath: Secondary | ICD-10-CM | POA: Insufficient documentation

## 2012-07-18 DIAGNOSIS — K219 Gastro-esophageal reflux disease without esophagitis: Secondary | ICD-10-CM | POA: Insufficient documentation

## 2012-07-18 DIAGNOSIS — J40 Bronchitis, not specified as acute or chronic: Secondary | ICD-10-CM | POA: Insufficient documentation

## 2012-07-18 DIAGNOSIS — I10 Essential (primary) hypertension: Secondary | ICD-10-CM | POA: Insufficient documentation

## 2012-07-18 DIAGNOSIS — R062 Wheezing: Secondary | ICD-10-CM | POA: Insufficient documentation

## 2012-07-18 HISTORY — DX: Gastro-esophageal reflux disease without esophagitis: K21.9

## 2012-07-18 HISTORY — DX: Hyperlipidemia, unspecified: E78.5

## 2012-07-18 HISTORY — DX: Pneumonia, unspecified organism: J18.9

## 2012-07-18 MED ORDER — IPRATROPIUM BROMIDE 0.02 % IN SOLN
0.5000 mg | Freq: Once | RESPIRATORY_TRACT | Status: AC
  Administered 2012-07-18: 0.5 mg via RESPIRATORY_TRACT

## 2012-07-18 MED ORDER — IPRATROPIUM BROMIDE 0.02 % IN SOLN
RESPIRATORY_TRACT | Status: AC
  Administered 2012-07-18: 0.5 mg via RESPIRATORY_TRACT
  Filled 2012-07-18: qty 2.5

## 2012-07-18 MED ORDER — AZITHROMYCIN 250 MG PO TABS: 250.0000 mg | ORAL_TABLET | Freq: Every day | ORAL | Status: DC

## 2012-07-18 MED ORDER — ALBUTEROL SULFATE (5 MG/ML) 0.5% IN NEBU
INHALATION_SOLUTION | RESPIRATORY_TRACT | Status: AC
  Administered 2012-07-18: 5 mg via RESPIRATORY_TRACT
  Filled 2012-07-18: qty 1

## 2012-07-18 MED ORDER — ALBUTEROL SULFATE HFA 108 (90 BASE) MCG/ACT IN AERS: 1.0000 | INHALATION_SPRAY | Freq: Four times a day (QID) | RESPIRATORY_TRACT | Status: DC | PRN

## 2012-07-18 MED ORDER — ALBUTEROL SULFATE (5 MG/ML) 0.5% IN NEBU
5.0000 mg | INHALATION_SOLUTION | Freq: Once | RESPIRATORY_TRACT | Status: AC
  Administered 2012-07-18: 5 mg via RESPIRATORY_TRACT

## 2012-07-18 NOTE — ED Provider Notes (Signed)
History     CSN: [id number]  Arrival date & time 07/18/12  1201   First MD Initiated Contact with Patient 07/18/12 1259      Chief Complaint  Patient presents with  . Cough  . URI    (Consider location/radiation/quality/duration/timing/severity/associated sxs/prior treatment) Patient is a 56 y.o. male presenting with cough and URI. The history is provided by the patient. No language interpreter was used.  Cough This is a new problem. The current episode started yesterday (5 days ago). The problem occurs constantly. The problem has been gradually worsening. The cough is non-productive. There has been no fever. Associated symptoms include shortness of breath and wheezing. He has tried nothing for the symptoms. The treatment provided no relief. He is a smoker. His past medical history is significant for pneumonia.  URI The primary symptoms include cough and wheezing.   Pt reports he has been coughing up colored phelgm Past Medical History  Diagnosis Date  . Hypertension   . GERD (gastroesophageal reflux disease)   . Hyperlipemia   . Pneumonia     Past Surgical History  Procedure Date  . Back surgery     No family history on file.  History  Substance Use Topics  . Smoking status: Current Every Day Smoker -- 0.5 packs/day    Types: Cigarettes  . Smokeless tobacco: Current User  . Alcohol Use: Yes     Comment: occasional      Review of Systems  Respiratory: Positive for cough, shortness of breath and wheezing.   All other systems reviewed and are negative.    Allergies  Nsaids  Home Medications   Current Outpatient Rx  Name  Route  Sig  Dispense  Refill  . ESOMEPRAZOLE MAGNESIUM 20 MG PO CPDR   Oral   Take 20 mg by mouth daily before breakfast.         . FAMOTIDINE 20 MG PO TABS   Oral   Take 1 tablet (20 mg total) by mouth 2 (two) times daily.   20 tablet   0   . LISINOPRIL-HYDROCHLOROTHIAZIDE 10-12.5 MG PO TABS   Oral   Take 1 tablet by mouth  daily.         Marland Kitchen SIMVASTATIN 20 MG PO TABS   Oral   Take 20 mg by mouth every evening.           BP 119/78  Pulse 84  Temp 98.8 F (37.1 C) (Oral)  Resp 22  SpO2 96%  Physical Exam  Nursing note and vitals reviewed. Constitutional: He appears well-developed and well-nourished.  HENT:  Head: Normocephalic.  Right Ear: External ear normal.  Left Ear: External ear normal.  Nose: Nose normal.  Mouth/Throat: Oropharynx is clear and moist.  Eyes: Conjunctivae normal and EOM are normal. Pupils are equal, round, and reactive to light.  Neck: Normal range of motion.  Cardiovascular: Normal rate and normal heart sounds.   Pulmonary/Chest: Effort normal.       Rhonchi all lobes  Abdominal: Soft.  Musculoskeletal: Normal range of motion.  Neurological: He is alert.  Skin: Skin is warm.  Psychiatric: He has a normal mood and affect.    ED Course  Procedures (including critical care time)  Labs Reviewed - No data to display Dg Chest 2 View  07/18/2012  *RADIOLOGY REPORT*  Clinical Data: Cough and congestion.  CHEST - 2 VIEW  Comparison: Two-view chest 04/14/2011.  Findings: The heart size is normal.  Mild interstitial coarsening is chronic.  No focal airspace disease is evident.  Minimal degenerative changes in the thoracic spine are stable.  IMPRESSION:  1.  No acute cardiopulmonary disease or significant interval change. 2.  Stable chronic interstitial coarsening.   Original Report Authenticated By: Marin Roberts, M.D.      No diagnosis found.    MDM  Pt given albuterol neb.   Rx for albuterol and zithromax.   Pt advised to stop smoking.   I reviewed chest xray with pt and advised him of changes due to smoking       Lonia Skinner [place], [place] 07/18/12 [phone]

## 2012-07-18 NOTE — ED Notes (Signed)
Patient transported to X-ray 

## 2012-07-18 NOTE — ED Notes (Signed)
Pt returned from radiology.

## 2012-07-18 NOTE — ED Notes (Signed)
Pt reports coughing and congestion that started Wednesday.  He has been taking OTC medications without relief.

## 2012-07-18 NOTE — ED Provider Notes (Signed)
History/physical exam/procedure(s) were performed by non-physician practitioner and as supervising physician I was immediately available for consultation/collaboration. I have reviewed all notes and am in agreement with care and plan.   Hilario Quarry, MD 07/18/12 [phone]

## 2012-10-05 ENCOUNTER — Emergency Department (HOSPITAL_BASED_OUTPATIENT_CLINIC_OR_DEPARTMENT_OTHER)
Admission: EM | Admit: 2012-10-05 | Discharge: 2012-10-05 | Disposition: A | Discharge status: Home / Self Care | Payer: BC Managed Care – PPO | Attending: Emergency Medicine | Admitting: Emergency Medicine

## 2012-10-05 ENCOUNTER — Encounter (HOSPITAL_BASED_OUTPATIENT_CLINIC_OR_DEPARTMENT_OTHER): Payer: Self-pay | Admitting: *Deleted

## 2012-10-05 DIAGNOSIS — K219 Gastro-esophageal reflux disease without esophagitis: Secondary | ICD-10-CM | POA: Insufficient documentation

## 2012-10-05 DIAGNOSIS — I10 Essential (primary) hypertension: Secondary | ICD-10-CM | POA: Insufficient documentation

## 2012-10-05 DIAGNOSIS — F17200 Nicotine dependence, unspecified, uncomplicated: Secondary | ICD-10-CM | POA: Insufficient documentation

## 2012-10-05 DIAGNOSIS — R197 Diarrhea, unspecified: Secondary | ICD-10-CM | POA: Insufficient documentation

## 2012-10-05 DIAGNOSIS — K5289 Other specified noninfective gastroenteritis and colitis: Secondary | ICD-10-CM | POA: Insufficient documentation

## 2012-10-05 DIAGNOSIS — R066 Hiccough: Secondary | ICD-10-CM | POA: Insufficient documentation

## 2012-10-05 DIAGNOSIS — Z79899 Other long term (current) drug therapy: Secondary | ICD-10-CM | POA: Insufficient documentation

## 2012-10-05 DIAGNOSIS — E785 Hyperlipidemia, unspecified: Secondary | ICD-10-CM | POA: Insufficient documentation

## 2012-10-05 DIAGNOSIS — Z8701 Personal history of pneumonia (recurrent): Secondary | ICD-10-CM | POA: Insufficient documentation

## 2012-10-05 DIAGNOSIS — K529 Noninfective gastroenteritis and colitis, unspecified: Secondary | ICD-10-CM

## 2012-10-05 MED ORDER — PANTOPRAZOLE SODIUM 40 MG IV SOLR
40.0000 mg | Freq: Once | INTRAVENOUS | Status: AC
  Administered 2012-10-05: 40 mg via INTRAVENOUS
  Filled 2012-10-05: qty 40

## 2012-10-05 MED ORDER — SODIUM CHLORIDE 0.9 % IV BOLUS (SEPSIS)
1000.0000 mL | Freq: Once | INTRAVENOUS | Status: AC
  Administered 2012-10-05: 1000 mL via INTRAVENOUS

## 2012-10-05 MED ORDER — FENTANYL CITRATE 0.05 MG/ML IJ SOLN
50.0000 ug | Freq: Once | INTRAMUSCULAR | Status: AC
  Administered 2012-10-05: 50 ug via INTRAVENOUS
  Filled 2012-10-05: qty 2

## 2012-10-05 MED ORDER — FAMOTIDINE IN NACL 20-0.9 MG/50ML-% IV SOLN
20.0000 mg | Freq: Once | INTRAVENOUS | Status: AC
  Administered 2012-10-05: 20 mg via INTRAVENOUS
  Filled 2012-10-05: qty 50

## 2012-10-05 MED ORDER — ONDANSETRON 8 MG PO TBDP: 8.0000 mg | ORAL_TABLET | Freq: Three times a day (TID) | ORAL | Status: DC | PRN

## 2012-10-05 MED ORDER — PROMETHAZINE HCL 25 MG/ML IJ SOLN
25.0000 mg | Freq: Once | INTRAMUSCULAR | Status: AC
  Administered 2012-10-05: 25 mg via INTRAVENOUS
  Filled 2012-10-05: qty 1

## 2012-10-05 NOTE — ED Notes (Signed)
Pt lying supine with mask in place, reports his wife has been sick with a gi virus, "and I got it yesterday". Pt reports numerous episodes of diarrhea and vomiting x yesterday afternoon around 4pm. Pt states he has burning in his epigastric area "all the way up to my throat from throwing up..." .

## 2012-10-05 NOTE — ED Provider Notes (Signed)
History     CSN: [id number]  Arrival date & time 10/05/12  0921   First MD Initiated Contact with Patient 10/05/12 1020      Chief Complaint  Patient presents with  . Nausea, Vomiting and Diarrhea     (Consider location/radiation/quality/duration/timing/severity/associated sxs/prior treatment) HPI This is a 57 year old male with nausea, vomiting and diarrhea since yesterday afternoon. He states his wife had a similar illness recently. Symptoms are moderate to severe. The vomiting is resulted in turning in his epigastric region radiating up to his throat; he describes this as feeling like severe acid reflux. He has also been having hiccups. He does not feel lightheaded nor does he have a dry mouth. There no specific mitigating or exacerbating factors. He denies abdominal pain apart from the burning.  Past Medical History  Diagnosis Date  . Hypertension   . GERD (gastroesophageal reflux disease)   . Hyperlipemia   . Pneumonia     Past Surgical History  Procedure Laterality Date  . Back surgery      History reviewed. No pertinent family history.  History  Substance Use Topics  . Smoking status: Current Every Day Smoker -- 0.50 packs/day    Types: Cigarettes  . Smokeless tobacco: Current User  . Alcohol Use: Yes     Comment: occasional      Review of Systems  All other systems reviewed and are negative.    Allergies  Nsaids  Home Medications   Current Outpatient Rx  Name  Route  Sig  Dispense  Refill  . omeprazole (PRILOSEC) 40 MG capsule   Oral   Take 40 mg by mouth daily.         Marland Kitchen albuterol (PROVENTIL HFA;VENTOLIN HFA) 108 (90 BASE) MCG/ACT inhaler   Inhalation   Inhale 1-2 puffs into the lungs every 6 (six) hours as needed for wheezing.   1 Inhaler   0   . azithromycin (ZITHROMAX) 250 MG tablet   Oral   Take 1 tablet (250 mg total) by mouth daily. Take first 2 tablets together, then 1 every day until finished.   6 tablet   0   . esomeprazole  (NEXIUM) 20 MG capsule   Oral   Take 20 mg by mouth daily before breakfast.         . famotidine (PEPCID) 20 MG tablet   Oral   Take 1 tablet (20 mg total) by mouth 2 (two) times daily.   20 tablet   0   . lisinopril-hydrochlorothiazide (PRINZIDE,ZESTORETIC) 10-12.5 MG per tablet   Oral   Take 1 tablet by mouth daily.         . simvastatin (ZOCOR) 20 MG tablet   Oral   Take 20 mg by mouth every evening.           BP 147/79  Pulse 81  Temp(Src) 97.9 F (36.6 C) (Oral)  Resp 18  Ht 5\' 10"  (1.778 m)  Wt 180 lb (81.647 kg)  BMI 25.83 kg/m2  SpO2 100%  Physical Exam General: Well-developed, well-nourished male in no acute distress; appearance consistent with age of record HENT: normocephalic, atraumatic; no pharyngeal erythema or exudate Eyes: pupils equal round and reactive to light; extraocular muscles intact Neck: supple Heart: regular rate and rhythm Lungs: clear to auscultation bilaterally Abdomen: soft; nondistended; nontender; no masses or hepatosplenomegaly; bowel sounds present Extremities: No deformity; full range of motion Neurologic: Awake, alert and oriented; motor function intact in all extremities and symmetric; no facial droop Skin:  Warm and dry Psychiatric: Flat affect    ED Course  Procedures (including critical care time)    MDM  12:32 PM Feels much better after IV fluids and IV medications. His nausea is controlled. His acid reflux is controlled. His hiccups are controlled. The Phenergan made him fairly somnolent and he requests Zofran prescription instead.        Hanley Seamen, MD 10/05/12 1233

## 2013-09-27 ENCOUNTER — Encounter: Payer: BC Managed Care – PPO | Admitting: Sports Medicine

## 2013-10-03 ENCOUNTER — Emergency Department (HOSPITAL_BASED_OUTPATIENT_CLINIC_OR_DEPARTMENT_OTHER): Payer: BC Managed Care – PPO

## 2013-10-03 ENCOUNTER — Emergency Department (HOSPITAL_BASED_OUTPATIENT_CLINIC_OR_DEPARTMENT_OTHER)
Admission: EM | Admit: 2013-10-03 | Discharge: 2013-10-03 | Disposition: A | Discharge status: Home / Self Care | Payer: BC Managed Care – PPO | Attending: Emergency Medicine | Admitting: Emergency Medicine

## 2013-10-03 ENCOUNTER — Encounter (HOSPITAL_BASED_OUTPATIENT_CLINIC_OR_DEPARTMENT_OTHER): Payer: Self-pay | Admitting: Emergency Medicine

## 2013-10-03 DIAGNOSIS — K219 Gastro-esophageal reflux disease without esophagitis: Secondary | ICD-10-CM | POA: Insufficient documentation

## 2013-10-03 DIAGNOSIS — Z79899 Other long term (current) drug therapy: Secondary | ICD-10-CM | POA: Insufficient documentation

## 2013-10-03 DIAGNOSIS — I10 Essential (primary) hypertension: Secondary | ICD-10-CM | POA: Insufficient documentation

## 2013-10-03 DIAGNOSIS — Z8701 Personal history of pneumonia (recurrent): Secondary | ICD-10-CM | POA: Insufficient documentation

## 2013-10-03 DIAGNOSIS — E785 Hyperlipidemia, unspecified: Secondary | ICD-10-CM | POA: Insufficient documentation

## 2013-10-03 DIAGNOSIS — Z792 Long term (current) use of antibiotics: Secondary | ICD-10-CM | POA: Insufficient documentation

## 2013-10-03 DIAGNOSIS — K2970 Gastritis, unspecified, without bleeding: Secondary | ICD-10-CM | POA: Insufficient documentation

## 2013-10-03 DIAGNOSIS — K2990 Gastroduodenitis, unspecified, without bleeding: Principal | ICD-10-CM

## 2013-10-03 DIAGNOSIS — F17200 Nicotine dependence, unspecified, uncomplicated: Secondary | ICD-10-CM | POA: Insufficient documentation

## 2013-10-03 LAB — COMPREHENSIVE METABOLIC PANEL
ALT: 16 U/L (ref 0–53)
AST: 14 U/L (ref 0–37)
Albumin: 3.8 g/dL (ref 3.5–5.2)
Alkaline Phosphatase: 72 U/L (ref 39–117)
BUN: 30 mg/dL — ABNORMAL HIGH (ref 6–23)
CO2: 24 mEq/L (ref 19–32)
Calcium: 9.6 mg/dL (ref 8.4–10.5)
Chloride: 100 mEq/L (ref 96–112)
Creatinine, Ser: 1 mg/dL (ref 0.50–1.35)
GFR calc Af Amer: >90 mL/min (ref >90)
GFR calc non Af Amer: 82 mL/min — ABNORMAL LOW (ref >90)
Glucose, Bld: 117 mg/dL — ABNORMAL HIGH (ref 70–99)
Potassium: 4.5 mEq/L (ref 3.7–5.3)
Sodium: 138 mEq/L (ref 137–147)
Total Bilirubin: 0.6 mg/dL (ref 0.3–1.2)
Total Protein: 6.9 g/dL (ref 6.0–8.3)

## 2013-10-03 LAB — CBC WITH DIFFERENTIAL/PLATELET
Basophils Absolute: 0 10*3/uL (ref 0.0–0.1)
Basophils Relative: 0 % (ref 0–1)
Eosinophils Absolute: 0.1 10*3/uL (ref 0.0–0.7)
Eosinophils Relative: 0 % (ref 0–5)
HCT: 45.4 % (ref 39.0–52.0)
Hemoglobin: 15.3 g/dL (ref 13.0–17.0)
Lymphocytes Relative: 12 % (ref 12–46)
Lymphs Abs: 1.7 10*3/uL (ref 0.7–4.0)
MCH: 29.5 pg (ref 26.0–34.0)
MCHC: 33.7 g/dL (ref 30.0–36.0)
MCV: 87.6 fL (ref 78.0–100.0)
Monocytes Absolute: 1.3 10*3/uL — ABNORMAL HIGH (ref 0.1–1.0)
Monocytes Relative: 9 % (ref 3–12)
Neutro Abs: 10.9 10*3/uL — ABNORMAL HIGH (ref 1.7–7.7)
Neutrophils Relative %: 79 % — ABNORMAL HIGH (ref 43–77)
Platelets: 239 10*3/uL (ref 150–400)
RBC: 5.18 MIL/uL (ref 4.22–5.81)
RDW: 13.7 % (ref 11.5–15.5)
WBC: 14 10*3/uL — ABNORMAL HIGH (ref 4.0–10.5)

## 2013-10-03 LAB — TROPONIN I: Troponin I: <0.3 ng/mL (ref <0.30)

## 2013-10-03 LAB — LIPASE, BLOOD: Lipase: 27 U/L (ref 11–59)

## 2013-10-03 MED ORDER — SODIUM CHLORIDE 0.9 % IV BOLUS (SEPSIS)
1000.0000 mL | Freq: Once | INTRAVENOUS | Status: AC
  Administered 2013-10-03: 1000 mL via INTRAVENOUS

## 2013-10-03 MED ORDER — PANTOPRAZOLE SODIUM 20 MG PO TBEC: 20.0000 mg | DELAYED_RELEASE_TABLET | Freq: Every day | ORAL | Status: DC

## 2013-10-03 MED ORDER — ONDANSETRON HCL 4 MG/2ML IJ SOLN
4.0000 mg | Freq: Once | INTRAMUSCULAR | Status: AC
  Administered 2013-10-03: 4 mg via INTRAVENOUS
  Filled 2013-10-03: qty 2

## 2013-10-03 MED ORDER — PROMETHAZINE HCL 25 MG/ML IJ SOLN
12.5000 mg | Freq: Once | INTRAMUSCULAR | Status: DC
  Filled 2013-10-03: qty 1

## 2013-10-03 MED ORDER — FAMOTIDINE IN NACL 20-0.9 MG/50ML-% IV SOLN
20.0000 mg | Freq: Once | INTRAVENOUS | Status: DC
  Filled 2013-10-03: qty 50

## 2013-10-03 MED ORDER — PANTOPRAZOLE SODIUM 40 MG IV SOLR
40.0000 mg | Freq: Once | INTRAVENOUS | Status: AC
  Administered 2013-10-03: 40 mg via INTRAVENOUS
  Filled 2013-10-03: qty 40

## 2013-10-03 MED ORDER — GI COCKTAIL ~~LOC~~
30.0000 mL | Freq: Once | ORAL | Status: AC
  Administered 2013-10-03: 30 mL via ORAL
  Filled 2013-10-03: qty 30

## 2013-10-03 MED ORDER — ONDANSETRON 4 MG PO TBDP: 4.0000 mg | ORAL_TABLET | Freq: Three times a day (TID) | ORAL | Status: DC | PRN

## 2013-10-03 NOTE — ED Provider Notes (Signed)
CSN: [id number]     Arrival date & time 10/03/13  0917 History   First MD Initiated Contact with Patient 10/03/13 [phone]     Chief Complaint  Patient presents with  . diarrhea  vomiting abdominal pain from reflux      (Consider location/radiation/quality/duration/timing/severity/associated sxs/prior Treatment) HPI Comments: Pt state that he started having diarrhea yesterday and then when he woke up this morning he had an emesis that was black in color. Pt state that he has a history of this and it is from his reflux. Pt states that he also started having epigastric pain which is similar to when his reflux acts up. Pt has been taking the omeprazole:denies fever or abdominal pain  The history is provided by the patient. No language interpreter was used.    Past Medical History  Diagnosis Date  . Hypertension   . GERD (gastroesophageal reflux disease)   . Hyperlipemia   . Pneumonia    Past Surgical History  Procedure Laterality Date  . Back surgery     History reviewed. No pertinent family history. History  Substance Use Topics  . Smoking status: Current Every Day Smoker -- 0.50 packs/day    Types: Cigarettes  . Smokeless tobacco: Current User  . Alcohol Use: Yes     Comment: occasional    Review of Systems  Constitutional: Negative.   Respiratory: Negative.   Cardiovascular: Negative.       Allergies  Nsaids  Home Medications   Current Outpatient Rx  Name  Route  Sig  Dispense  Refill  . ondansetron (ZOFRAN ODT) 8 MG disintegrating tablet   Oral   Take 1 tablet (8 mg total) by mouth every 8 (eight) hours as needed for nausea.   10 tablet   0   . albuterol (PROVENTIL HFA;VENTOLIN HFA) 108 (90 BASE) MCG/ACT inhaler   Inhalation   Inhale 1-2 puffs into the lungs every 6 (six) hours as needed for wheezing.   1 Inhaler   0   . azithromycin (ZITHROMAX) 250 MG tablet   Oral   Take 1 tablet (250 mg total) by mouth daily. Take first 2 tablets together, then 1  every day until finished.   6 tablet   0   . esomeprazole (NEXIUM) 20 MG capsule   Oral   Take 20 mg by mouth daily before breakfast.         . EXPIRED: famotidine (PEPCID) 20 MG tablet   Oral   Take 1 tablet (20 mg total) by mouth 2 (two) times daily.   20 tablet   0   . lisinopril-hydrochlorothiazide (PRINZIDE,ZESTORETIC) 10-12.5 MG per tablet   Oral   Take 1 tablet by mouth daily.         Marland Kitchen omeprazole (PRILOSEC) 40 MG capsule   Oral   Take 40 mg by mouth daily.         . simvastatin (ZOCOR) 20 MG tablet   Oral   Take 20 mg by mouth every evening.          BP 138/82  Pulse 94  Temp(Src) 98.3 F (36.8 C) (Oral)  Resp 18  SpO2 97% Physical Exam  Nursing note and vitals reviewed. Constitutional: He is oriented to person, place, and time. He appears well-developed and well-nourished.  Cardiovascular: Normal rate and regular rhythm.   Pulmonary/Chest: Effort normal and breath sounds normal.  Abdominal: There is tenderness in the epigastric area.  Musculoskeletal: Normal range of motion.  Neurological: He is alert  and oriented to person, place, and time.  Skin: Skin is warm and dry.  Psychiatric: He has a normal mood and affect.    ED Course  Procedures (including critical care time) Labs Review Labs Reviewed  COMPREHENSIVE METABOLIC PANEL - Abnormal; Notable for the following:    Glucose, Bld 117 (*)    BUN 30 (*)    GFR calc non Af Amer 82 (*)    All other components within normal limits  CBC WITH DIFFERENTIAL - Abnormal; Notable for the following:    WBC 14.0 (*)    Neutrophils Relative % 79 (*)    Neutro Abs 10.9 (*)    Monocytes Absolute 1.3 (*)    All other components within normal limits  LIPASE, BLOOD  TROPONIN I   Imaging Review No results found.  EKG Interpretation    Date/Time:  Monday October 03 2013 09:37:07 EST Ventricular Rate:  88 PR Interval:  142 QRS Duration: 78 QT Interval:  352 QTC Calculation: 425 R Axis:   77 Text  Interpretation:  Normal sinus rhythm Normal ECG No old tracing to compare Confirmed by WARD  DO, KRISTEN ([id number]) on 10/03/2013 9:37:52 AM            MDM   Final diagnoses:  Gastritis  GERD (gastroesophageal reflux disease)    Pt symptoms resolved after the gi cocktail. Pt is tolerating po without any problem. Likely partially viral and gerd:pt given gi referral:pt didn't vomiting here so unable to check for blood. hgb within normal limits    Teressa LowerVrinda Amarien Carne, NP 10/03/13 1336

## 2013-10-03 NOTE — ED Provider Notes (Signed)
Medical screening examination/treatment/procedure(s) were performed by non-physician practitioner and as supervising physician I was immediately available for consultation/collaboration.  EKG Interpretation    Date/Time:  Monday October 03 2013 09:37:07 EST Ventricular Rate:  88 PR Interval:  142 QRS Duration: 78 QT Interval:  352 QTC Calculation: 425 R Axis:   77 Text Interpretation:  Normal sinus rhythm Normal ECG No old tracing to compare Confirmed by WARD  DO, KRISTEN ([id number]) on 10/03/2013 9:37:52 AM              Layla MawKristen N Ward, DO 10/03/13 [phone]

## 2013-10-03 NOTE — ED Notes (Signed)
Onset last night states he was having acid reflux and a lot of belching progressed to diarrhea vomited x 1 states emesis was black and has had that before in the past.

## 2013-10-03 NOTE — Discharge Instructions (Signed)
Diet for Gastroesophageal Reflux Disease, Adult  Reflux is when stomach acid flows up into the esophagus. The esophagus becomes irritated and sore (inflammation). When reflux happens often and is severe, it is called gastroesophageal reflux disease (GERD). What you eat can help ease any discomfort caused by GERD.  FOODS OR DRINKS TO AVOID OR LIMIT   Coffee and black tea, with or without caffeine.   Bubbly (carbonated) drinks with caffeine or energy drinks.   Strong spices, such as pepper, cayenne pepper, curry, or chili powder.   Peppermint or spearmint.   Chocolate.   High-fat foods, such as meats, fried food, oils, butter, or nuts.   Fruits and vegetables that cause discomfort. This includes citrus fruits and tomatoes.   Alcohol.  If a certain food or drink irritates your GERD, avoid eating or drinking it.  THINGS THAT MAY HELP GERD INCLUDE:   Eat meals slowly.   Eat 5 to 6 small meals a day, not 3 large meals.   Do not eat food for a certain amount of time if it causes discomfort.   Wait 3 hours after eating before lying down.   Keep the head of your bed raised 6 to 9 inches (15 23 centimeters). Put a foam wedge or blocks under the legs of the bed.   Stay active. Weight loss, if needed, may help ease your discomfort.   Wear loose-fitting clothing.   Do not smoke or chew tobacco.  Document Released: 01/27/2012 Document Reviewed: 01/27/2012  [employer] Patient Information 2014 [employer].

## 2013-10-03 NOTE — ED Notes (Signed)
Patient transported to X-ray 

## 2013-10-03 NOTE — ED Notes (Signed)
Pt refused the meds stating the burping episodes have passed. Spouse at the bedside is concerned that "we need to find out what is causing all of this if everything is normal"

## 2015-03-16 ENCOUNTER — Encounter (HOSPITAL_BASED_OUTPATIENT_CLINIC_OR_DEPARTMENT_OTHER): Payer: Self-pay

## 2015-03-16 ENCOUNTER — Emergency Department (HOSPITAL_BASED_OUTPATIENT_CLINIC_OR_DEPARTMENT_OTHER)
Admission: EM | Admit: 2015-03-16 | Discharge: 2015-03-16 | Disposition: A | Discharge status: Home / Self Care | Payer: BLUE CROSS/BLUE SHIELD | Attending: Emergency Medicine | Admitting: Emergency Medicine

## 2015-03-16 DIAGNOSIS — I10 Essential (primary) hypertension: Secondary | ICD-10-CM | POA: Insufficient documentation

## 2015-03-16 DIAGNOSIS — E785 Hyperlipidemia, unspecified: Secondary | ICD-10-CM | POA: Diagnosis not present

## 2015-03-16 DIAGNOSIS — Z720 Tobacco use: Secondary | ICD-10-CM | POA: Insufficient documentation

## 2015-03-16 DIAGNOSIS — Z8701 Personal history of pneumonia (recurrent): Secondary | ICD-10-CM | POA: Insufficient documentation

## 2015-03-16 DIAGNOSIS — Z79899 Other long term (current) drug therapy: Secondary | ICD-10-CM | POA: Insufficient documentation

## 2015-03-16 DIAGNOSIS — Z9889 Other specified postprocedural states: Secondary | ICD-10-CM | POA: Diagnosis not present

## 2015-03-16 DIAGNOSIS — K219 Gastro-esophageal reflux disease without esophagitis: Secondary | ICD-10-CM | POA: Insufficient documentation

## 2015-03-16 DIAGNOSIS — M545 Low back pain, unspecified: Secondary | ICD-10-CM

## 2015-03-16 MED ORDER — TRAMADOL HCL 50 MG PO TABS: 50.0000 mg | ORAL_TABLET | Freq: Four times a day (QID) | ORAL | Status: DC | PRN

## 2015-03-16 MED ORDER — DEXAMETHASONE SODIUM PHOSPHATE 10 MG/ML IJ SOLN
10.0000 mg | Freq: Once | INTRAMUSCULAR | Status: AC
  Administered 2015-03-16: 10 mg via INTRAMUSCULAR
  Filled 2015-03-16: qty 1

## 2015-03-16 MED ORDER — METHOCARBAMOL 500 MG PO TABS: 500.0000 mg | ORAL_TABLET | Freq: Two times a day (BID) | ORAL | Status: DC

## 2015-03-16 NOTE — ED Provider Notes (Signed)
CSN: [id number]     Arrival date & time 03/16/15  1317 History   First MD Initiated Contact with Patient 03/16/15 1329     Chief Complaint  Patient presents with  . Back Pain     (Consider location/radiation/quality/duration/timing/severity/associated sxs/prior Treatment) HPI Comments: Patient with a history of hypertension and hyperlipidemia presents with back pain. He states he had back surgery about 10-11 years ago by Dr. Newell Coral but has not had any problems since that time. He states that 4 days ago he was lifting a lawnmower and had a sudden onset of pulling pain in his right lower back. He states the pain got better yesterday but then got worse again today. He states the pain in his right lower back and radiates to his right groin. He denies any radiation down his leg. There is no numbness or weakness in his leg. He's having intermittent muscle spasms in his right back. He denies abdominal pain. There is no dizziness. No numbness or weakness to his extremities. No problems with bowel or bladder control.  Patient is a 59 y.o. male presenting with back pain.  Back Pain Associated symptoms: no abdominal pain, no chest pain, no fever, no headaches, no numbness and no weakness     Past Medical History  Diagnosis Date  . Hypertension   . GERD (gastroesophageal reflux disease)   . Hyperlipemia   . Pneumonia    Past Surgical History  Procedure Laterality Date  . Back surgery     History reviewed. No pertinent family history. History  Substance Use Topics  . Smoking status: Current Every Day Smoker -- 0.50 packs/day    Types: Cigarettes  . Smokeless tobacco: Current User  . Alcohol Use: Yes     Comment: occasional    Review of Systems  Constitutional: Negative for fever, chills, diaphoresis and fatigue.  HENT: Negative for congestion, rhinorrhea and sneezing.   Eyes: Negative.   Respiratory: Negative for cough, chest tightness and shortness of breath.   Cardiovascular:  Negative for chest pain and leg swelling.  Gastrointestinal: Negative for nausea, vomiting, abdominal pain, diarrhea and blood in stool.  Genitourinary: Negative for frequency, hematuria, flank pain and difficulty urinating.  Musculoskeletal: Positive for back pain. Negative for arthralgias.  Skin: Negative for rash.  Neurological: Negative for dizziness, speech difficulty, weakness, numbness and headaches.      Allergies  Nsaids  Home Medications   Prior to Admission medications   Medication Sig Start Date End Date Taking? Authorizing Provider  Fenofibrate (TRICOR PO) Take by mouth.   Yes Historical Provider, MD  lisinopril-hydrochlorothiazide (PRINZIDE,ZESTORETIC) 10-12.5 MG per tablet Take 1 tablet by mouth daily.   Yes Historical Provider, MD  omeprazole (PRILOSEC) 40 MG capsule Take 40 mg by mouth daily.   Yes Historical Provider, MD  simvastatin (ZOCOR) 20 MG tablet Take 20 mg by mouth every evening.   Yes Historical Provider, MD  albuterol (PROVENTIL HFA;VENTOLIN HFA) 108 (90 BASE) MCG/ACT inhaler Inhale 1-2 puffs into the lungs every 6 (six) hours as needed for wheezing. 07/18/12   Elson Areas, PA-C  azithromycin (ZITHROMAX) 250 MG tablet Take 1 tablet (250 mg total) by mouth daily. Take first 2 tablets together, then 1 every day until finished. 07/18/12   Elson Areas, PA-C  esomeprazole (NEXIUM) 20 MG capsule Take 20 mg by mouth daily before breakfast.    Historical Provider, MD  famotidine (PEPCID) 20 MG tablet Take 1 tablet (20 mg total) by mouth 2 (two) times daily.  01/19/12 01/18/13  Emeline General, MD  fenofibrate (TRICOR) 48 MG tablet Take 48 mg by mouth daily.    Historical Provider, MD  methocarbamol (ROBAXIN) 500 MG tablet Take 1 tablet (500 mg total) by mouth 2 (two) times daily. 03/16/15   Rolan Bucco, MD  ondansetron (ZOFRAN ODT) 4 MG disintegrating tablet Take 1 tablet (4 mg total) by mouth every 8 (eight) hours as needed for nausea or vomiting. 10/03/13   Teressa Lower, NP  ondansetron (ZOFRAN ODT) 8 MG disintegrating tablet Take 1 tablet (8 mg total) by mouth every 8 (eight) hours as needed for nausea. 10/05/12   John Molpus, MD  pantoprazole (PROTONIX) 20 MG tablet Take 1 tablet (20 mg total) by mouth daily. 10/03/13   Teressa Lower, NP  traMADol (ULTRAM) 50 MG tablet Take 1 tablet (50 mg total) by mouth every 6 (six) hours as needed. 03/16/15   Rolan Bucco, MD   BP 146/87 mmHg  Pulse 85  Temp(Src) 98.4 F (36.9 C) (Oral)  Resp 18  Ht 5\' 10"  (1.778 m)  Wt 158 lb (71.668 kg)  BMI 22.67 kg/m2  SpO2 97% Physical Exam  Constitutional: He is oriented to person, place, and time. He appears well-developed and well-nourished.  HENT:  Head: Normocephalic and atraumatic.  Eyes: Pupils are equal, round, and reactive to light.  Neck: Normal range of motion. Neck supple.  Cardiovascular: Normal rate, regular rhythm and normal heart sounds.   Pulmonary/Chest: Effort normal and breath sounds normal. No respiratory distress. He has no wheezes. He has no rales. He exhibits no tenderness.  Abdominal: Soft. Bowel sounds are normal. There is no tenderness. There is no rebound and no guarding.  Musculoskeletal: Normal range of motion. He exhibits no edema.  Positive tenderness in the right lower lumbar region in the lateral musculature. There is no bony tenderness to the spine. Negative straight leg raise bilaterally. Patellar reflexes intact. He has normal motor function and sensation in the lower extremities. Pedal pulses are intact.  Lymphadenopathy:    He has no cervical adenopathy.  Neurological: He is alert and oriented to person, place, and time.  Skin: Skin is warm and dry. No rash noted.  Psychiatric: He has a normal mood and affect.    ED Course  Procedures (including critical care time) Labs Review Labs Reviewed - No data to display  Imaging Review No results found.   EKG Interpretation None      MDM   Final diagnoses:   Right-sided low back pain without sciatica    Patient with right lower back pain. He was given dose of Decadron in the ED. He was given prescriptions for tramadol and Robaxin use for symptomatic relief. He did not want any other stronger medications. He states she's allergic to NSAIDs but has had steroid injections in the past without problem. He has no neurologic deficits or signs of cauda equina. He will follow-up with his neurosurgeon. He is under the impression that we could do a CT scan of his back in the ED. I advised him that a CT scan would not be beneficial and that he donated MRI if his symptoms are not improving. We are unable to do an MRI at this facility today and I don't feel that he has an emergent need for MRI.    Rolan Bucco, MD 03/16/15 [phone]

## 2015-03-16 NOTE — Discharge Instructions (Signed)
Back Pain, Adult Low back pain is very common. About 1 in 5 people have back pain.The cause of low back pain is rarely dangerous. The pain often gets better over time.About half of people with a sudden onset of back pain feel better in just 2 weeks. About 8 in 10 people feel better by 6 weeks.  CAUSES Some common causes of back pain include:  Strain of the muscles or ligaments supporting the spine.  Wear and tear (degeneration) of the spinal discs.  Arthritis.  Direct injury to the back. DIAGNOSIS Most of the time, the direct cause of low back pain is not known.However, back pain can be treated effectively even when the exact cause of the pain is unknown.Answering your caregiver's questions about your overall health and symptoms is one of the most accurate ways to make sure the cause of your pain is not dangerous. If your caregiver needs more information, he or she may order lab work or imaging tests (X-rays or MRIs).However, even if imaging tests show changes in your back, this usually does not require surgery. HOME CARE INSTRUCTIONS For many people, back pain returns.Since low back pain is rarely dangerous, it is often a condition that people can learn to manageon their own.   Remain active. It is stressful on the back to sit or stand in one place. Do not sit, drive, or stand in one place for more than 30 minutes at a time. Take short walks on level surfaces as soon as pain allows.Try to increase the length of time you walk each day.  Do not stay in bed.Resting more than 1 or 2 days can delay your recovery.  Do not avoid exercise or work.Your body is made to move.It is not dangerous to be active, even though your back may hurt.Your back will likely heal faster if you return to being active before your pain is gone.  Pay attention to your body when you bend and lift. Many people have less discomfortwhen lifting if they bend their knees, keep the load close to their bodies,and  avoid twisting. Often, the most comfortable positions are those that put less stress on your recovering back.  Find a comfortable position to sleep. Use a firm mattress and lie on your side with your knees slightly bent. If you lie on your back, put a pillow under your knees.  Only take over-the-counter or prescription medicines as directed by your caregiver. Over-the-counter medicines to reduce pain and inflammation are often the most helpful.Your caregiver may prescribe muscle relaxant drugs.These medicines help dull your pain so you can more quickly return to your normal activities and healthy exercise.  Put ice on the injured area.  Put ice in a plastic bag.  Place a towel between your skin and the bag.  Leave the ice on for 15-20 minutes, 03-04 times a day for the first 2 to 3 days. After that, ice and heat may be alternated to reduce pain and spasms.  Ask your caregiver about trying back exercises and gentle massage. This may be of some benefit.  Avoid feeling anxious or stressed.Stress increases muscle tension and can worsen back pain.It is important to recognize when you are anxious or stressed and learn ways to manage it.Exercise is a great option. SEEK MEDICAL CARE IF:  You have pain that is not relieved with rest or medicine.  You have pain that does not improve in 1 week.  You have new symptoms.  You are generally not feeling well. SEEK   IMMEDIATE MEDICAL CARE IF:   You have pain that radiates from your back into your legs.  You develop new bowel or bladder control problems.  You have unusual weakness or numbness in your arms or legs.  You develop nausea or vomiting.  You develop abdominal pain.  You feel faint. Document Released: 07/28/2005 Document Revised: 01/27/2012 Document Reviewed: 11/29/2013 [employer] Patient Information 2015 [employer]. This information is not intended to replace advice given to you by your health care provider. Make sure you  discuss any questions you have with your health care provider.  

## 2015-03-16 NOTE — ED Notes (Addendum)
Pt reports lower back pain since Tuesday night - history of same - referred by NeuroSurgeon to ED - requesting CT scan - denies incontinence - denies numbness/tingling. Surgery 10 years ago to lumbar discs. Pt does report lifting on Tuesday - states he has not had pain since surgery 10 years ago.

## 2015-08-23 ENCOUNTER — Encounter (HOSPITAL_BASED_OUTPATIENT_CLINIC_OR_DEPARTMENT_OTHER): Payer: Self-pay | Admitting: *Deleted

## 2015-08-23 ENCOUNTER — Emergency Department (HOSPITAL_BASED_OUTPATIENT_CLINIC_OR_DEPARTMENT_OTHER)
Admission: EM | Admit: 2015-08-23 | Discharge: 2015-08-23 | Disposition: A | Discharge status: Home / Self Care | Payer: BLUE CROSS/BLUE SHIELD | Attending: Emergency Medicine | Admitting: Emergency Medicine

## 2015-08-23 DIAGNOSIS — J069 Acute upper respiratory infection, unspecified: Secondary | ICD-10-CM | POA: Diagnosis not present

## 2015-08-23 DIAGNOSIS — F17210 Nicotine dependence, cigarettes, uncomplicated: Secondary | ICD-10-CM | POA: Insufficient documentation

## 2015-08-23 DIAGNOSIS — Z79899 Other long term (current) drug therapy: Secondary | ICD-10-CM | POA: Diagnosis not present

## 2015-08-23 DIAGNOSIS — E785 Hyperlipidemia, unspecified: Secondary | ICD-10-CM | POA: Diagnosis not present

## 2015-08-23 DIAGNOSIS — I10 Essential (primary) hypertension: Secondary | ICD-10-CM | POA: Insufficient documentation

## 2015-08-23 DIAGNOSIS — K219 Gastro-esophageal reflux disease without esophagitis: Secondary | ICD-10-CM | POA: Diagnosis not present

## 2015-08-23 DIAGNOSIS — Z8701 Personal history of pneumonia (recurrent): Secondary | ICD-10-CM | POA: Diagnosis not present

## 2015-08-23 DIAGNOSIS — R05 Cough: Secondary | ICD-10-CM | POA: Diagnosis present

## 2015-08-23 MED ORDER — BENZONATATE 100 MG PO CAPS: 100.0000 mg | ORAL_CAPSULE | Freq: Three times a day (TID) | ORAL | Status: DC

## 2015-08-23 MED FILL — BENZONATATE 100 MG CAPSULE: 100 | 7 days supply | Qty: 21 | Fill #0

## 2015-08-23 NOTE — ED Provider Notes (Signed)
CSN: [id number]     Arrival date & time 08/23/15  1257 History   First MD Initiated Contact with Patient 08/23/15 1259     Chief Complaint  Patient presents with  . URI      HPI  Patient process or evaluation of body aches and a cough. States he's had a cough for about 4-5 days. Felt feverish and low energy last night. However, he states that he walked for a half miles at work yesterday per his "fit". I did not feel dyspneic. He rested well last night. Took some Tylenol before bed. States he has some discomfort under both ribs anteriorly when he coughs. No pain with quiet respirations are normal ADLs. Feels congested but no pain to his sinuses. Clear nasal discharge. Dry nonproductive cough. He was immunized for influenza.  Past Medical History  Diagnosis Date  . Hypertension   . GERD (gastroesophageal reflux disease)   . Hyperlipemia   . Pneumonia    Past Surgical History  Procedure Laterality Date  . Back surgery     No family history on file. Social History  Substance Use Topics  . Smoking status: Current Every Day Smoker -- 0.50 packs/day    Types: Cigarettes  . Smokeless tobacco: Current User  . Alcohol Use: Yes     Comment: occasional    Review of Systems  Constitutional: Negative for fever, chills, diaphoresis, appetite change and fatigue.  HENT: Positive for congestion, rhinorrhea and sinus pressure. Negative for mouth sores, sore throat and trouble swallowing.   Eyes: Negative for visual disturbance.  Respiratory: Positive for cough. Negative for chest tightness, shortness of breath and wheezing.   Cardiovascular: Negative for chest pain.  Gastrointestinal: Negative for nausea, vomiting, abdominal pain, diarrhea and abdominal distention.  Endocrine: Negative for polydipsia, polyphagia and polyuria.  Genitourinary: Negative for dysuria, frequency and hematuria.  Musculoskeletal: Positive for myalgias. Negative for gait problem.  Skin: Negative for color change,  pallor and rash.  Neurological: Negative for dizziness, syncope, light-headedness and headaches.  Hematological: Does not bruise/bleed easily.  Psychiatric/Behavioral: Negative for behavioral problems and confusion.      Allergies  Nsaids  Home Medications   Prior to Admission medications   Medication Sig Start Date End Date Taking? Authorizing Provider  albuterol (PROVENTIL HFA;VENTOLIN HFA) 108 (90 BASE) MCG/ACT inhaler Inhale 1-2 puffs into the lungs every 6 (six) hours as needed for wheezing. 07/18/12   Elson AreasLeslie K Sofia, PA-C  azithromycin (ZITHROMAX) 250 MG tablet Take 1 tablet (250 mg total) by mouth daily. Take first 2 tablets together, then 1 every day until finished. 07/18/12   Elson AreasLeslie K Sofia, PA-C  benzonatate (TESSALON) 100 MG capsule Take 1 capsule (100 mg total) by mouth every 8 (eight) hours. 08/23/15   Rolland PorterMark Cintia Gleed, MD  esomeprazole (NEXIUM) 20 MG capsule Take 20 mg by mouth daily before breakfast.    Historical Provider, MD  famotidine (PEPCID) 20 MG tablet Take 1 tablet (20 mg total) by mouth 2 (two) times daily. 01/19/12 01/18/13  Emeline GeneralKathleen Hosmer, MD  Fenofibrate (TRICOR PO) Take by mouth.    Historical Provider, MD  fenofibrate (TRICOR) 48 MG tablet Take 48 mg by mouth daily.    Historical Provider, MD  lisinopril-hydrochlorothiazide (PRINZIDE,ZESTORETIC) 10-12.5 MG per tablet Take 1 tablet by mouth daily.    Historical Provider, MD  methocarbamol (ROBAXIN) 500 MG tablet Take 1 tablet (500 mg total) by mouth 2 (two) times daily. 03/16/15   Rolan BuccoMelanie Belfi, MD  omeprazole (PRILOSEC) 40 MG capsule  Take 40 mg by mouth daily.    Historical Provider, MD  ondansetron (ZOFRAN ODT) 4 MG disintegrating tablet Take 1 tablet (4 mg total) by mouth every 8 (eight) hours as needed for nausea or vomiting. 10/03/13   Teressa Lower, NP  ondansetron (ZOFRAN ODT) 8 MG disintegrating tablet Take 1 tablet (8 mg total) by mouth every 8 (eight) hours as needed for nausea. 10/05/12   John Molpus, MD   pantoprazole (PROTONIX) 20 MG tablet Take 1 tablet (20 mg total) by mouth daily. 10/03/13   Teressa Lower, NP  simvastatin (ZOCOR) 20 MG tablet Take 20 mg by mouth every evening.    Historical Provider, MD  traMADol (ULTRAM) 50 MG tablet Take 1 tablet (50 mg total) by mouth every 6 (six) hours as needed. 03/16/15   Rolan Bucco, MD   BP 125/82 mmHg  Pulse 78  Temp(Src) 98.5 F (36.9 C) (Oral)  Resp 18  Ht 5\' 10"  (1.778 m)  Wt 182 lb (82.555 kg)  BMI 26.11 kg/m2  SpO2 97% Physical Exam  Constitutional: He is oriented to person, place, and time. He appears well-developed and well-nourished. No distress.  HENT:  Head: Normocephalic.  Nares clear. Minimal nasal mucosal edema. Normal posterior pharynx. Nontender to palpate over the sinuses. Normal TMs.  Eyes: Conjunctivae are normal. Pupils are equal, round, and reactive to light. No scleral icterus.  Neck: Normal range of motion. Neck supple. No thyromegaly present.  Cardiovascular: Normal rate and regular rhythm.  Exam reveals no gallop and no friction rub.   No murmur heard. Pulmonary/Chest: Effort normal and breath sounds normal. No respiratory distress. He has no wheezes. He has no rales.  Clear bilateral breath sounds. No wheezing or prolongation. No focal diminished breath sounds. Afebrile on recheck oral temp. Sat 98%. Respiratory rate 12.  Abdominal: Soft. Bowel sounds are normal. He exhibits no distension. There is no tenderness. There is no rebound.  Musculoskeletal: Normal range of motion.  Neurological: He is alert and oriented to person, place, and time.  Skin: Skin is warm and dry. No rash noted.  Psychiatric: He has a normal mood and affect. His behavior is normal.    ED Course  Procedures (including critical care time) Labs Review Labs Reviewed - No data to display  Imaging Review No results found. I have personally reviewed and evaluated these images and lab results as part of my medical decision-making.   EKG  Interpretation None      MDM   Final diagnoses:  URI (upper respiratory infection)    Symptoms and findings and history most consistent with viral upper Astra infection. We discussed expectant management and no need for antibiotic therapy with viral infection. No sign of localized or separate infection to suggest antibiotics. No indication for x-rays or additional testing. Plan is expectant management. Tessalon for cough.    Rolland Porter, MD 08/23/15 1321

## 2015-08-23 NOTE — Discharge Instructions (Signed)
Upper Respiratory Infection, Adult Most upper respiratory infections (URIs) are a viral infection of the air passages leading to the lungs. A URI affects the nose, throat, and upper air passages. The most common type of URI is nasopharyngitis and is typically referred to as "the common cold." URIs run their course and usually go away on their own. Most of the time, a URI does not require medical attention, but sometimes a bacterial infection in the upper airways can follow a viral infection. This is called a secondary infection. Sinus and middle ear infections are common types of secondary upper respiratory infections. Bacterial pneumonia can also complicate a URI. A URI can worsen asthma and chronic obstructive pulmonary disease (COPD). Sometimes, these complications can require emergency medical care and may be life threatening.  CAUSES Almost all URIs are caused by viruses. A virus is a type of germ and can spread from one person to another.  RISKS FACTORS You may be at risk for a URI if:   You smoke.   You have chronic heart or lung disease.  You have a weakened defense (immune) system.   You are very young or very old.   You have nasal allergies or asthma.  You work in crowded or poorly ventilated areas.  You work in health care facilities or schools. SIGNS AND SYMPTOMS  Symptoms typically develop 2-3 days after you come in contact with a cold virus. Most viral URIs last 7-10 days. However, viral URIs from the influenza virus (flu virus) can last 14-18 days and are typically more severe. Symptoms may include:   Runny or stuffy (congested) nose.   Sneezing.   Cough.   Sore throat.   Headache.   Fatigue.   Fever.   Loss of appetite.   Pain in your forehead, behind your eyes, and over your cheekbones (sinus pain).  Muscle aches.  DIAGNOSIS  Your health care provider may diagnose a URI by:  Physical exam.  Tests to check that your symptoms are not due to  another condition such as:  Strep throat.  Sinusitis.  Pneumonia.  Asthma. TREATMENT  A URI goes away on its own with time. It cannot be cured with medicines, but medicines may be prescribed or recommended to relieve symptoms. Medicines may help:  Reduce your fever.  Reduce your cough.  Relieve nasal congestion. HOME CARE INSTRUCTIONS   Take medicines only as directed by your health care provider.   Gargle warm saltwater or take cough drops to comfort your throat as directed by your health care provider.  Use a warm mist humidifier or inhale steam from a shower to increase air moisture. This may make it easier to breathe.  Drink enough fluid to keep your urine clear or pale yellow.   Eat soups and other clear broths and maintain good nutrition.   Rest as needed.   Return to work when your temperature has returned to normal or as your health care provider advises. You may need to stay home longer to avoid infecting others. You can also use a face mask and careful hand washing to prevent spread of the virus.  Increase the usage of your inhaler if you have asthma.   Do not use any tobacco products, including cigarettes, chewing tobacco, or electronic cigarettes. If you need help quitting, ask your health care provider. PREVENTION  The best way to protect yourself from getting a cold is to practice good hygiene.   Avoid oral or hand contact with people with cold   symptoms.   Wash your hands often if contact occurs.  There is no clear evidence that vitamin C, vitamin E, echinacea, or exercise reduces the chance of developing a cold. However, it is always recommended to get plenty of rest, exercise, and practice good nutrition.  SEEK MEDICAL CARE IF:   You are getting worse rather than better.   Your symptoms are not controlled by medicine.   You have chills.  You have worsening shortness of breath.  You have brown or red mucus.  You have yellow or brown nasal  discharge.  You have pain in your face, especially when you bend forward.  You have a fever.  You have swollen neck glands.  You have pain while swallowing.  You have white areas in the back of your throat. SEEK IMMEDIATE MEDICAL CARE IF:   You have severe or persistent:  Headache.  Ear pain.  Sinus pain.  Chest pain.  You have chronic lung disease and any of the following:  Wheezing.  Prolonged cough.  Coughing up blood.  A change in your usual mucus.  You have a stiff neck.  You have changes in your:  Vision.  Hearing.  Thinking.  Mood. MAKE SURE YOU:   Understand these instructions.  Will watch your condition.  Will get help right away if you are not doing well or get worse.   This information is not intended to replace advice given to you by your health care provider. Make sure you discuss any questions you have with your health care provider.   Document Released: 01/21/2001 Document Revised: 12/12/2014 Document Reviewed: 11/02/2013 Elsevier Interactive Patient Education 2016 [employer]  

## 2015-08-23 NOTE — ED Notes (Signed)
Fever, body aches, rib pain, facial pressure and head pressure for over a week. Today he started having a cough.

## 2015-08-23 NOTE — ED Notes (Signed)
Patient is alert and oriented x3.  He was given DC instructions and follow up visit instructions.  Patient gave verbal understanding.  He was DC ambulatory under his own power to home.  V/S stable.  He was not showing any signs of distress on DC 

## 2015-12-24 DIAGNOSIS — L814 Other melanin hyperpigmentation: Secondary | ICD-10-CM | POA: Diagnosis not present

## 2015-12-24 DIAGNOSIS — L820 Inflamed seborrheic keratosis: Secondary | ICD-10-CM | POA: Diagnosis not present

## 2016-08-06 DIAGNOSIS — N400 Enlarged prostate without lower urinary tract symptoms: Secondary | ICD-10-CM | POA: Diagnosis not present

## 2016-08-25 ENCOUNTER — Encounter: Payer: Self-pay | Admitting: Family Medicine

## 2016-08-25 DIAGNOSIS — I10 Essential (primary) hypertension: Secondary | ICD-10-CM | POA: Insufficient documentation

## 2016-08-25 DIAGNOSIS — K219 Gastro-esophageal reflux disease without esophagitis: Secondary | ICD-10-CM | POA: Insufficient documentation

## 2016-08-25 DIAGNOSIS — E782 Mixed hyperlipidemia: Secondary | ICD-10-CM | POA: Insufficient documentation

## 2016-08-26 ENCOUNTER — Encounter: Payer: Self-pay | Admitting: Family Medicine

## 2016-08-26 ENCOUNTER — Ambulatory Visit (INDEPENDENT_AMBULATORY_CARE_PROVIDER_SITE_OTHER): Payer: BLUE CROSS/BLUE SHIELD | Admitting: Family Medicine

## 2016-08-26 VITALS — BP 122/80 | HR 76 | Resp 12 | Ht 70.0 in | Wt 182.2 lb

## 2016-08-26 DIAGNOSIS — E782 Mixed hyperlipidemia: Secondary | ICD-10-CM

## 2016-08-26 DIAGNOSIS — Z131 Encounter for screening for diabetes mellitus: Secondary | ICD-10-CM

## 2016-08-26 DIAGNOSIS — Z23 Encounter for immunization: Secondary | ICD-10-CM | POA: Diagnosis not present

## 2016-08-26 DIAGNOSIS — K219 Gastro-esophageal reflux disease without esophagitis: Secondary | ICD-10-CM

## 2016-08-26 DIAGNOSIS — Z0000 Encounter for general adult medical examination without abnormal findings: Secondary | ICD-10-CM | POA: Diagnosis not present

## 2016-08-26 DIAGNOSIS — E785 Hyperlipidemia, unspecified: Secondary | ICD-10-CM

## 2016-08-26 DIAGNOSIS — I10 Essential (primary) hypertension: Secondary | ICD-10-CM

## 2016-08-26 DIAGNOSIS — Z1211 Encounter for screening for malignant neoplasm of colon: Secondary | ICD-10-CM

## 2016-08-26 DIAGNOSIS — Z1159 Encounter for screening for other viral diseases: Secondary | ICD-10-CM | POA: Diagnosis not present

## 2016-08-26 LAB — LIPID PANEL
Cholesterol: 145 mg/dL (ref 0–200)
HDL: 35.9 mg/dL — ABNORMAL LOW (ref >39.00)
LDL Cholesterol: 81 mg/dL (ref 0–99)
NonHDL: 108.64
Total CHOL/HDL Ratio: 4
Triglycerides: 137 mg/dL (ref 0.0–149.0)
VLDL: 27.4 mg/dL (ref 0.0–40.0)

## 2016-08-26 LAB — COMPREHENSIVE METABOLIC PANEL
ALT: 14 U/L (ref 0–53)
AST: 13 U/L (ref 0–37)
Albumin: 4.4 g/dL (ref 3.5–5.2)
Alkaline Phosphatase: 61 U/L (ref 39–117)
BUN: 13 mg/dL (ref 6–23)
CO2: 27 mEq/L (ref 19–32)
Calcium: 9.6 mg/dL (ref 8.4–10.5)
Chloride: 102 mEq/L (ref 96–112)
Creatinine, Ser: 1.07 mg/dL (ref 0.40–1.50)
GFR: 75.19 mL/min (ref >60.00)
Glucose, Bld: 83 mg/dL (ref 70–99)
Potassium: 4.3 mEq/L (ref 3.5–5.1)
Sodium: 138 mEq/L (ref 135–145)
Total Bilirubin: 0.5 mg/dL (ref 0.2–1.2)
Total Protein: 6.5 g/dL (ref 6.0–8.3)

## 2016-08-26 LAB — HEMOGLOBIN A1C: Hgb A1c MFr Bld: 6.1 % (ref 4.6–6.5)

## 2016-08-26 MED ORDER — FENOFIBRATE 48 MG PO TABS: 48.0000 mg | ORAL_TABLET | Freq: Every day | ORAL | 2 refills | Status: DC

## 2016-08-26 MED ORDER — PRAVASTATIN SODIUM 40 MG PO TABS: 40.0000 mg | ORAL_TABLET | Freq: Every day | ORAL | 3 refills | Status: DC

## 2016-08-26 MED ORDER — LISINOPRIL 20 MG PO TABS: 20.0000 mg | ORAL_TABLET | Freq: Every day | ORAL | 2 refills | Status: DC

## 2016-08-26 MED ORDER — OMEPRAZOLE 20 MG PO CPDR: 20.0000 mg | DELAYED_RELEASE_CAPSULE | Freq: Every day | ORAL | 3 refills | Status: DC

## 2016-08-26 NOTE — Progress Notes (Signed)
Pre visit review using our clinic review tool, if applicable. No additional management support is needed unless otherwise documented below in the visit note. 

## 2016-08-26 NOTE — Progress Notes (Signed)
HPI:   Steven Cook is a 61 y.o. male, who is here today to establish care with me.  Former PCP: Dr Yehuda Budd Last preventive routine visit: a year ago. He needs a form sign for insurance purposes staying that he has had a physical, so would like one done today.  Urology evaluation annually, last one 07/2016. Hearing loss, bilateral. Pending hearing aids through the [place].  He tries to follow a healthy diet and walks 30 miles or so through work.  He denies Hx of CVD.  Hypertension:  Dx many years ago. Currently on Lisinopril 20 mg daily.    He is taking medications as instructed, no side effects reported.  He has not noted unusual headache, visual changes, exertional chest pain, dyspnea,  focal weakness, or edema. He does not check BP routinely.   Hyperlipidemia:  Currently on Pravastatin 40 mg daily. Following a low fat diet: Yes.  He has not noted side effects with medication.  Last FLP 08/2015.   GERD, symptoms well controlled with Omeprazole 20 mg.  He also needs a referral for colonoscopy, due 08/2017. Reporting Hx of hemorrhoids with intermittent dyschezia and hematochezia (seldom blood on tissue after defecation), he uses OTC Prep H.  Denies abdominal pain, nausea, vomiting, changes in bowel habits, blood in stool or melena.  He denies Hx of diabetes, has had elevated glucose in the past: 117 and 156.   Review of Systems  Constitutional: Negative for activity change, appetite change, fatigue, fever and unexpected weight change.  HENT: Positive for hearing loss. Negative for dental problem, nosebleeds, sore throat, trouble swallowing and voice change.   Eyes: Negative for pain, redness and visual disturbance.  Respiratory: Negative for apnea, cough, shortness of breath and wheezing.   Cardiovascular: Negative for chest pain, palpitations and leg swelling.  Gastrointestinal: Negative for abdominal pain, blood in stool, nausea and vomiting.  Endocrine:  Negative for polydipsia, polyphagia and polyuria.  Genitourinary: Negative for decreased urine volume, dysuria, genital sores, hematuria and testicular pain.  Musculoskeletal: Negative for gait problem, myalgias and neck pain.  Skin: Negative for color change and rash.  Neurological: Negative for dizziness, seizures, syncope, weakness, numbness and headaches.  Hematological: Negative for adenopathy. Does not bruise/bleed easily.  Psychiatric/Behavioral: Negative for confusion and sleep disturbance. The patient is not nervous/anxious.       No current outpatient prescriptions on file prior to visit.   No current facility-administered medications on file prior to visit.      Past Medical History:  Diagnosis Date  . GERD (gastroesophageal reflux disease)   . Hyperlipemia   . Hypertension   . Pneumonia    Allergies  Allergen Reactions  . Aspirin Shortness Of Breath  . Nsaids Anaphylaxis    No family history on file.  Social History   Social History  . Marital status: Married    Spouse name: N/A  . Number of children: N/A  . Years of education: N/A   Social History Main Topics  . Smoking status: Current Every Day Smoker    Packs/day: 0.50    Types: Cigarettes  . Smokeless tobacco: Current User  . Alcohol use Yes     Comment: occasional  . Drug use: No  . Sexual activity: Not Asked   Other Topics Concern  . None   Social History Narrative  . None    Vitals:   08/26/16 1017  BP: 122/80  Pulse: 76  Resp: 12   O2 sat 98%  at RA.  Body mass index is 26.14 kg/m.   Physical Exam  Nursing note and vitals reviewed. Constitutional: He is oriented to person, place, and time. He appears well-developed and well-nourished. No distress.  HENT:  Head: Atraumatic.  Mouth/Throat: Oropharynx is clear and moist and mucous membranes are normal.  Eyes: Conjunctivae and EOM are normal. Pupils are equal, round, and reactive to light.  Neck: No JVD present. No thyroid  mass and no thyromegaly (palpable) present.  Cardiovascular: Normal rate and regular rhythm.   Occasional extrasystoles (x1) are present.  No murmur heard. Pulses:      Dorsalis pedis pulses are 2+ on the right side, and 2+ on the left side.  Respiratory: Effort normal and breath sounds normal. No respiratory distress.  GI: Soft. He exhibits no mass. There is no hepatomegaly. There is no tenderness.  Musculoskeletal: He exhibits no edema.  Lymphadenopathy:    He has no cervical adenopathy.       Right: No supraclavicular adenopathy present.       Left: No supraclavicular adenopathy present.  Neurological: He is alert and oriented to person, place, and time. He has normal strength. No cranial nerve deficit. Coordination and gait normal.  Skin: Skin is warm. No erythema.  Psychiatric: He has a normal mood and affect. Cognition and memory are normal.  Well groomed, good eye contact.      ASSESSMENT AND PLAN:     [place] was seen today for establish care and CPE.  Diagnoses and all orders for this visit:  Lab Results  Component Value Date   CHOL 145 08/26/2016   HDL 35.90 (L) 08/26/2016   LDLCALC 81 08/26/2016   TRIG 137.0 08/26/2016   CHOLHDL 4 08/26/2016     Chemistry      Component Value Date/Time   NA 138 08/26/2016 1115   K 4.3 08/26/2016 1115   CL 102 08/26/2016 1115   CO2 27 08/26/2016 1115   BUN 13 08/26/2016 1115   CREATININE 1.07 08/26/2016 1115      Component Value Date/Time   CALCIUM 9.6 08/26/2016 1115   ALKPHOS 61 08/26/2016 1115   AST 13 08/26/2016 1115   ALT 14 08/26/2016 1115   BILITOT 0.5 08/26/2016 1115     Lab Results  Component Value Date   HGBA1C 6.1 08/26/2016     Routine physical examination   We discussed the importance of regular physical activity and healthy diet for prevention of chronic illness and/or complications. Preventive guidelines reviewed. Vaccination up to date. Next CPE in 1-2 years.   Essential  hypertension  Adequately controlled. No changes in current management. DASH-low salt diet recommended. Eye exam recommended annually. F/U in 6 months, before if needed.  -     EKG 12-Lead: SR, normal axis and intervals.  -     Comprehensive metabolic panel -     lisinopril (PRINIVIL,ZESTRIL) 20 MG tablet; Take 1 tablet (20 mg total) by mouth daily.  Mixed hyperlipidemia  No changes in current management, will follow labs done today and will give further recommendations accordingly.  -     Lipid panel -     Comprehensive metabolic panel -     pravastatin (PRAVACHOL) 40 MG tablet; Take 1 tablet (40 mg total) by mouth daily. -     fenofibrate (TRICOR) 48 MG tablet; Take 1 tablet (48 mg total) by mouth daily.  Colon cancer screening -     Ambulatory referral to Gastroenterology  Diabetes mellitus screening -  Hemoglobin A1c  Gastroesophageal reflux disease, esophagitis presence not specified  Stable. No changes in current management. F/U in 6-12 months.  -     omeprazole (PRILOSEC) 20 MG capsule; Take 1 capsule (20 mg total) by mouth daily.  Need for prophylactic vaccination and inoculation against influenza -     Flu Vaccine QUAD 36+ mos IM  Encounter for hepatitis C screening test for low risk patient -     Hepatitis C Antibody      Jolette Lana G. Swazil[place]Jordan, MD  [facility]. [place] office.

## 2016-08-26 NOTE — Patient Instructions (Addendum)
A few things to remember from today's visit:   Essential hypertension - Plan: EKG 12-Lead, Comprehensive metabolic panel, lisinopril (PRINIVIL,ZESTRIL) 20 MG tablet  Hyperlipidemia, unspecified hyperlipidemia type - Plan: Lipid panel, Comprehensive metabolic panel, pravastatin (PRAVACHOL) 40 MG tablet, fenofibrate (TRICOR) 48 MG tablet  Colon cancer screening - Plan: Ambulatory referral to Gastroenterology  Hyperglycemia - Plan: Hemoglobin A1c  Gastroesophageal reflux disease, esophagitis presence not specified - Plan: omeprazole (PRILOSEC) 20 MG capsule  Mixed hyperlipidemia  No changes today.  Follow up on blood pressure in 6 months. Smoking cessation encouraged.     We have ordered labs or studies at this visit.  It can take up to 1-2 weeks for results and processing. IF results require follow up or explanation, we will call you with instructions. Clinically stable results will be released to your [facility]. If you have not heard from us or cannot find your results in [facility] in 2 weeks please contact our office at [phone].  If you are not yet signed up for [facility], please consider signing up  Please be sure medication list is accurate. If a new problem present, please set up appointment sooner than planned today.

## 2016-08-27 ENCOUNTER — Encounter: Payer: Self-pay | Admitting: Family Medicine

## 2016-08-27 LAB — HEPATITIS C ANTIBODY: HCV Ab: NEGATIVE

## 2016-09-18 DIAGNOSIS — D124 Benign neoplasm of descending colon: Secondary | ICD-10-CM | POA: Diagnosis not present

## 2016-09-18 DIAGNOSIS — K5730 Diverticulosis of large intestine without perforation or abscess without bleeding: Secondary | ICD-10-CM | POA: Diagnosis not present

## 2016-09-18 DIAGNOSIS — Z1211 Encounter for screening for malignant neoplasm of colon: Secondary | ICD-10-CM | POA: Diagnosis not present

## 2016-09-18 DIAGNOSIS — K635 Polyp of colon: Secondary | ICD-10-CM | POA: Diagnosis not present

## 2016-09-18 DIAGNOSIS — D126 Benign neoplasm of colon, unspecified: Secondary | ICD-10-CM | POA: Diagnosis not present

## 2016-09-18 DIAGNOSIS — K648 Other hemorrhoids: Secondary | ICD-10-CM | POA: Diagnosis not present

## 2017-02-22 NOTE — Progress Notes (Signed)
HPI:   [name] is a 61 y.o. male, who is here today to follow on some chronic medical problems.  He was last seen on 08/26/16 for his CPE. [place] He is frustrated because he has not been able to get hearing aids, he sometimes avoid social gatherings because cannot hear well and it is "embarrassing"  for him to keep asking to repeat what somebody just said. He denies depressed mood.  He is still smoking. He tells me that he stopped smoking for 8 days and did not feel like he needed to smoke but still went back to smoking 2 cig daily, morning and with dinner. He tells me that most likely his GERD symptoms will resolved if he does wquit smoking. He is on Prilosec and after as he takes medication daily symptoms are well controlled, unless he eats something like apple pie and go to bed right after.   Hypertension:   Currently on Lisinopril 20 mg daily.  Home BP readings: Not checking it. Last eye exam: 3 months ago.  He is taking medications as instructed, no side effects reported.  He has not noted headache, visual changes, exertional chest pain, dyspnea,  focal weakness, or edema.   Lab Results  Component Value Date   CREATININE 1.07 08/26/2016   BUN 13 08/26/2016   NA 138 08/26/2016   K 4.3 08/26/2016   CL 102 08/26/2016   CO2 27 08/26/2016     Hyperlipidemia:  Currently on Pravachol 40 mg. Last OV Tricor 48 mg was discontinued. Following a low fat diet: yes.  He has not noted side effects with medication.  Lab Results  Component Value Date   CHOL 145 08/26/2016   HDL 35.90 (L) 08/26/2016   LDLCALC 81 08/26/2016   TRIG 137.0 08/26/2016   CHOLHDL 4 08/26/2016     Review of Systems  Constitutional: Negative for activity change, appetite change, fatigue, fever and unexpected weight change.  HENT: Positive for hearing loss. Negative for ear discharge, ear pain, nosebleeds, sore throat and trouble  swallowing.   Eyes: Negative for redness and visual disturbance.  Respiratory: Negative for cough, shortness of breath and wheezing.   Cardiovascular: Negative for chest pain, palpitations and leg swelling.  Gastrointestinal: Negative for abdominal pain, nausea and vomiting.  Genitourinary: Negative for decreased urine volume, dysuria and hematuria.  Musculoskeletal: Negative for gait problem and myalgias.  Neurological: Negative for dizziness, syncope, weakness and headaches.  Psychiatric/Behavioral: Negative for confusion and sleep disturbance. The patient is not nervous/anxious.       Current Outpatient Prescriptions on File Prior to Visit  Medication Sig Dispense Refill  . lisinopril (PRINIVIL,ZESTRIL) 20 MG tablet Take 1 tablet (20 mg total) by mouth daily. 90 tablet 2  . omeprazole (PRILOSEC) 20 MG capsule Take 1 capsule (20 mg total) by mouth daily. 90 capsule 3  . pravastatin (PRAVACHOL) 40 MG tablet Take 1 tablet (40 mg total) by mouth daily. 90 tablet 3   No current facility-administered medications on file prior to visit.      Past Medical History:  Diagnosis Date  . Allergy   . GERD (gastroesophageal reflux disease)   . Hyperlipemia   . Hypertension   . Pneumonia    Allergies  Allergen Reactions  . Aspirin Shortness Of Breath  . Nsaids Anaphylaxis    Social History   Social History  . Marital status: Married    Spouse  name: N/A  . Number of children: N/A  . Years of education: N/A   Social History Main Topics  . Smoking status: Current Every Day Smoker    Packs/day: 0.50    Types: Cigarettes  . Smokeless tobacco: Current User  . Alcohol use Yes     Comment: occasional  . Drug use: No  . Sexual activity: Yes   Other Topics Concern  . None   Social History Narrative  . None    Vitals:   02/23/17 0800  BP: 122/80  Pulse: 82  Resp: 12  O2 sat at RA 98% Body mass index is 26.69 kg/m.   Physical Exam  Nursing note and vitals  reviewed. Constitutional: He is oriented to person, place, and time. He appears well-developed and well-nourished. No distress.  HENT:  Head: Atraumatic.  Mouth/Throat: Oropharynx is clear and moist and mucous membranes are normal.  Eyes: Pupils are equal, round, and reactive to light. Conjunctivae and EOM are normal.  Cardiovascular: Normal rate and regular rhythm.   No murmur heard. Pulses:      Dorsalis pedis pulses are 2+ on the right side, and 2+ on the left side.  Respiratory: Effort normal and breath sounds normal. No respiratory distress.  GI: Soft. He exhibits no mass. There is no hepatomegaly. There is no tenderness.  Musculoskeletal: He exhibits no edema or tenderness.  Lymphadenopathy:    He has no cervical adenopathy.  Neurological: He is alert and oriented to person, place, and time. He has normal strength. Gait normal.  Skin: Skin is warm. No erythema.  Psychiatric: He has a normal mood and affect. Cognition and memory are normal.  Well groomed, good eye contact.     ASSESSMENT AND PLAN:   [name] was seen today for follow-up.  Diagnoses and all orders for this visit:  Essential hypertension  Adequately controlled. No changes in current management. DASH and low salt diet to continue. Eye exam recommended annually. F/U in 6 months, before if needed.  -     Basic metabolic panel  Mixed hyperlipidemia  No changes in current management, will follow labs done today and will give further recommendations accordingly. F/U in 6-12 months.  -     Lipid panel  Tobacco use disorder  Adverse effects discussed, strongly encouraged to quit smoking now that he feels like he can do it. We could try Nicotine or other treatments. He thinks he can do it without medication.  Wellbutrin is a good options for him.  Sensorineural hearing loss (SNHL), unspecified laterality  He is depending on the [place] to get hearing aids but it seems to be causing social isolation. I  recommend trying to find out if his insurance will cover and [facility].     -[name] was advised to return sooner than planned today if new concerns arise.       Steven G. [place], MD  [facility]. [place] office.

## 2017-02-23 ENCOUNTER — Encounter: Payer: Self-pay | Admitting: Family Medicine

## 2017-02-23 ENCOUNTER — Ambulatory Visit (INDEPENDENT_AMBULATORY_CARE_PROVIDER_SITE_OTHER): Payer: BLUE CROSS/BLUE SHIELD | Admitting: Family Medicine

## 2017-02-23 VITALS — BP 122/80 | HR 82 | Resp 12 | Ht 70.0 in | Wt 186.0 lb

## 2017-02-23 DIAGNOSIS — H905 Unspecified sensorineural hearing loss: Secondary | ICD-10-CM | POA: Diagnosis not present

## 2017-02-23 DIAGNOSIS — H9190 Unspecified hearing loss, unspecified ear: Secondary | ICD-10-CM | POA: Insufficient documentation

## 2017-02-23 DIAGNOSIS — E782 Mixed hyperlipidemia: Secondary | ICD-10-CM

## 2017-02-23 DIAGNOSIS — F17200 Nicotine dependence, unspecified, uncomplicated: Secondary | ICD-10-CM

## 2017-02-23 DIAGNOSIS — I10 Essential (primary) hypertension: Secondary | ICD-10-CM

## 2017-02-23 LAB — LIPID PANEL
Cholesterol: 167 mg/dL (ref 0–200)
HDL: 35.9 mg/dL — ABNORMAL LOW (ref >39.00)
LDL Cholesterol: 101 mg/dL — ABNORMAL HIGH (ref 0–99)
NonHDL: 130.8
Total CHOL/HDL Ratio: 5
Triglycerides: 151 mg/dL — ABNORMAL HIGH (ref 0.0–149.0)
VLDL: 30.2 mg/dL (ref 0.0–40.0)

## 2017-02-23 LAB — BASIC METABOLIC PANEL
BUN: 18 mg/dL (ref 6–23)
CO2: 26 mEq/L (ref 19–32)
Calcium: 9.5 mg/dL (ref 8.4–10.5)
Chloride: 104 mEq/L (ref 96–112)
Creatinine, Ser: 1.02 mg/dL (ref 0.40–1.50)
GFR: 79.32 mL/min (ref >60.00)
Glucose, Bld: 129 mg/dL — ABNORMAL HIGH (ref 70–99)
Potassium: 4.3 mEq/L (ref 3.5–5.1)
Sodium: 138 mEq/L (ref 135–145)

## 2017-02-23 MED ORDER — LISINOPRIL 20 MG PO TABS: 20.0000 mg | ORAL_TABLET | Freq: Every day | ORAL | 2 refills | Status: DC

## 2017-02-23 NOTE — Patient Instructions (Signed)
A few things to remember from today's visit:   Essential hypertension - Plan: Basic metabolic panel  Mixed hyperlipidemia - Plan: Lipid panel   Blood pressure goal for most people is less than 140/90.   Most recent cardiologists' recommendations recommend blood pressure at or less than 130/80.   Elevated blood pressure increases the risk of strokes, heart and kidney disease, and eye problems. Regular physical activity and a healthy diet (DASH diet) usually help. Low salt diet. Take medications as instructed.  Caution with some over the counter medications as cold medications, dietary products (for weight loss), and Ibuprofen or Aleve (frequent use);all these medications could cause elevation of blood pressure.   Please be sure medication list is accurate. If a new problem present, please set up appointment sooner than planned today.

## 2017-03-02 ENCOUNTER — Encounter: Payer: Self-pay | Admitting: Family Medicine

## 2017-04-30 ENCOUNTER — Encounter: Payer: Self-pay | Admitting: Family Medicine

## 2017-08-07 ENCOUNTER — Other Ambulatory Visit: Payer: Self-pay | Admitting: Family Medicine

## 2017-08-07 DIAGNOSIS — K219 Gastro-esophageal reflux disease without esophagitis: Secondary | ICD-10-CM

## 2017-08-26 ENCOUNTER — Ambulatory Visit (INDEPENDENT_AMBULATORY_CARE_PROVIDER_SITE_OTHER): Payer: BLUE CROSS/BLUE SHIELD | Admitting: Family Medicine

## 2017-08-26 ENCOUNTER — Encounter: Payer: Self-pay | Admitting: Family Medicine

## 2017-08-26 VITALS — BP 125/84 | HR 64 | Temp 98.3°F | Resp 12 | Ht 70.0 in | Wt 179.4 lb

## 2017-08-26 DIAGNOSIS — E782 Mixed hyperlipidemia: Secondary | ICD-10-CM | POA: Diagnosis not present

## 2017-08-26 DIAGNOSIS — E785 Hyperlipidemia, unspecified: Secondary | ICD-10-CM

## 2017-08-26 DIAGNOSIS — Z114 Encounter for screening for human immunodeficiency virus [HIV]: Secondary | ICD-10-CM | POA: Diagnosis not present

## 2017-08-26 DIAGNOSIS — I10 Essential (primary) hypertension: Secondary | ICD-10-CM

## 2017-08-26 DIAGNOSIS — Z23 Encounter for immunization: Secondary | ICD-10-CM

## 2017-08-26 DIAGNOSIS — Z125 Encounter for screening for malignant neoplasm of prostate: Secondary | ICD-10-CM

## 2017-08-26 DIAGNOSIS — Z131 Encounter for screening for diabetes mellitus: Secondary | ICD-10-CM | POA: Diagnosis not present

## 2017-08-26 DIAGNOSIS — Z0000 Encounter for general adult medical examination without abnormal findings: Secondary | ICD-10-CM | POA: Diagnosis not present

## 2017-08-26 LAB — HEMOGLOBIN A1C: Hgb A1c MFr Bld: 6.3 % (ref 4.6–6.5)

## 2017-08-26 LAB — COMPREHENSIVE METABOLIC PANEL
ALT: 13 U/L (ref 0–53)
AST: 13 U/L (ref 0–37)
Albumin: 4.2 g/dL (ref 3.5–5.2)
Alkaline Phosphatase: 66 U/L (ref 39–117)
BUN: 14 mg/dL (ref 6–23)
CO2: 30 mEq/L (ref 19–32)
Calcium: 9.3 mg/dL (ref 8.4–10.5)
Chloride: 103 mEq/L (ref 96–112)
Creatinine, Ser: 1.06 mg/dL (ref 0.40–1.50)
GFR: 75.75 mL/min (ref >60.00)
Glucose, Bld: 99 mg/dL (ref 70–99)
Potassium: 4.3 mEq/L (ref 3.5–5.1)
Sodium: 140 mEq/L (ref 135–145)
Total Bilirubin: 0.4 mg/dL (ref 0.2–1.2)
Total Protein: 6.4 g/dL (ref 6.0–8.3)

## 2017-08-26 LAB — LIPID PANEL
Cholesterol: 147 mg/dL (ref 0–200)
HDL: 35.5 mg/dL — ABNORMAL LOW (ref >39.00)
LDL Cholesterol: 80 mg/dL (ref 0–99)
NonHDL: 111.1
Total CHOL/HDL Ratio: 4
Triglycerides: 157 mg/dL — ABNORMAL HIGH (ref 0.0–149.0)
VLDL: 31.4 mg/dL (ref 0.0–40.0)

## 2017-08-26 LAB — PSA: PSA: 2.12 ng/mL (ref 0.10–4.00)

## 2017-08-26 NOTE — Patient Instructions (Addendum)
A few things to remember from today's visit:   Routine general medical examination at a health care facility  Mixed hyperlipidemia - Plan: Lipid panel  Essential hypertension  Diabetes mellitus screening - Plan: Comprehensive metabolic panel, Hemoglobin A1c  Prostate cancer screening - Plan: PSA(Must document that pt has been informed of limitations of PSA testing.)  Encounter for screening for HIV - Plan: HIV antibody   At least 150 minutes of moderate exercise per week, daily brisk walking for 15-30 min is a good exercise option. Healthy diet low in saturated (animal) fats and sweets and consisting of fresh fruits and vegetables, lean meats such as fish and white chicken and whole grains.  - Vaccines:  Tdap vaccine every 10 years.  Shingles vaccine recommended at age 60, could be given after 60 years of age but not sure about insurance coverage.  Pneumonia vaccines:  Prevnar 13 at 65 and Pneumovax at 66. Today Pneumovax because you smoke and it increases risk of pneumonia.   -Screening recommendations for low/normal risk males:  Screening for diabetes at age 60 and every 3 years. Earlier screening if cardiovascular risk factors.     Colon cancer screening at age 750 and until age 60.  Prostate cancer screening: some controversy, starts usually at 50: Rectal exam and PSA.  Aortic Abdominal Aneurism once between 3365 and 222 years old if ever smoker.  Also recommended:  1. Dental visit- Brush and floss your teeth twice daily; visit your dentist twice a year. 2. Eye doctor- Get an eye exam at least every 2 years. 3. Helmet use- Always wear a helmet when riding a bicycle, motorcycle, rollerblading or skateboarding. 4. Safe sex- If you may be exposed to sexually transmitted infections, use a condom. 5. Seat belts- Seat belts can save your live; always wear one. 6. Smoke/Carbon Monoxide detectors- These detectors need to be installed on the appropriate level of your home.  Replace batteries at least once a year. 7. Skin cancer- When out in the sun please cover up and use sunscreen 15 SPF or higher. 8. Violence- If anyone is threatening or hurting you, please tell your healthcare provider.  9. Drink alcohol in moderation- Limit alcohol intake to one drink or less per day. Never drink and drive.   Please be sure medication list is accurate. If a new problem present, please set up appointment sooner than planned today.

## 2017-08-26 NOTE — Progress Notes (Signed)
HPI:  [name] is a 62 y.o.male here today for his routine physical examination.  Last CPE: 08/26/2016. He lives with his wife.  Regular exercise 3 or more times per week: Not consistently but his job intales a lot of walking,at least 30 miles per day. Following a healthy diet: Yes.   Chronic medical problems: [facility]),HTN,GERD, and HLD among some. [facility].  GERD on daily Omeprazole 20 mg , asymptomatic while taking med.  HTN: BP readings 120's/80's.   Hx of STD's: Denies  Immunization History  Administered Date(s) Administered  . Influenza,inj,Quad PF,6+ Mos 08/26/2016    -Hep C screening: NR 08/2016.   Last colon cancer screening: Per pt report he had colonoscopy a few months ago, 2018, a polyp removed. Last prostate ca screening: He follows annually with urologist but he would like PSA done today. Denies dysuria,increased urinary frequency, gross hematuria,or decreased urine output.   -Denies high alcohol intake or Hx of illicit drug use. + Smoker.  -No concerns today.     Review of Systems  Constitutional: Negative for activity change, appetite change, fatigue, fever and unexpected weight change.  HENT: Negative for dental problem, nosebleeds, sore throat, trouble swallowing and voice change.  + Hearing loss,bilateral and stable. Eyes: Negative for redness and visual disturbance.  Respiratory: Negative for apnea, cough, shortness of breath and wheezing.   Cardiovascular: Negative for chest pain, palpitations and leg swelling.  Gastrointestinal: Negative for abdominal pain, blood in stool, nausea and vomiting.       No changes in bowel habits.  Endocrine: Negative for cold intolerance, heat intolerance, polydipsia, polyphagia and polyuria.  Genitourinary: Negative for decreased urine volume, dysuria, genital sores, hematuria and testicular pain.  Musculoskeletal: Negative for gait  problem and myalgias.  Skin: Negative for color change and rash.  Allergic/Immunologic: Negative for environmental allergies.  Neurological: Negative for dizziness, seizures, syncope, weakness, numbness and headaches.  Hematological: Negative for adenopathy. Does not bruise/bleed easily.  Psychiatric/Behavioral: Negative for confusion and sleep disturbance. The patient is not nervous/anxious.   All other systems reviewed and are negative.    Current Outpatient Medications on File Prior to Visit  Medication Sig Dispense Refill  . lisinopril (PRINIVIL,ZESTRIL) 20 MG tablet Take 1 tablet (20 mg total) by mouth daily. 90 tablet 2  . omeprazole (PRILOSEC) 20 MG capsule TAKE 1 CAPSULE (20 MG TOTAL) BY MOUTH DAILY. 90 capsule 1  . pravastatin (PRAVACHOL) 40 MG tablet Take 1 tablet (40 mg total) by mouth daily. 90 tablet 3   No current facility-administered medications on file prior to visit.      Past Medical History:  Diagnosis Date  . Allergy   . GERD (gastroesophageal reflux disease)   . Hyperlipemia   . Hypertension   . Pneumonia     Past Surgical History:  Procedure Laterality Date  . BACK SURGERY      Allergies  Allergen Reactions  . Aspirin Shortness Of Breath  . Nsaids Anaphylaxis    Family History  Problem Relation Age of Onset  . Arthritis Mother   . Hyperlipidemia Father   . Hypertension Father   . Diabetes Father   . Diabetes Maternal Grandfather     Social History   Socioeconomic History  . Marital status: Married    Spouse name: None  . Number of children: None  . Years of education: None  . Highest education level: None  Social  Needs  . Financial resource strain: None  . Food insecurity - worry: None  . Food insecurity - inability: None  . Transportation needs - medical: None  . Transportation needs - non-medical: None  Occupational History  . None  Tobacco Use  . Smoking status: Current Every Day Smoker    Packs/day: 0.50    Types:  Cigarettes  . Smokeless tobacco: Current User  Substance and Sexual Activity  . Alcohol use: Yes    Comment: occasional  . Drug use: No  . Sexual activity: Yes  Other Topics Concern  . None  Social History Narrative  . None     Vitals:   08/26/17 0741  BP: 125/84  Pulse: 64  Resp: 12  Temp: 98.3 F (36.8 C)  SpO2: 99%   Body mass index is 25.74 kg/m.   Wt Readings from Last 3 Encounters:  08/26/17 179 lb 6 oz (81.4 kg)  02/23/17 186 lb (84.4 kg)  08/26/16 182 lb 3 oz (82.6 kg)    Physical Exam   Nursing note and vitals reviewed. Constitutional: He is oriented to person, place, and time. He appears well-developed. No distress.  HENT:  Head: Atraumatic.  Right Ear: Hearing, tympanic membrane, external ear and ear canal normal.  Left Ear: Hearing, tympanic membrane, external ear and ear canal normal.  + Hearing loss L>R. Mouth/Throat: Oropharynx is clear and moist and mucous membranes are normal.  Eyes: Conjunctivae and EOM are normal. Pupils are equal, round, and reactive to light.  Neck: Normal range of motion. No tracheal deviation present. No thyromegaly present.  Cardiovascular: Normal rate and regular rhythm.  No murmur heard. Pulses:      Dorsalis pedis pulses are 2+ on the right side, and 2+ on the left side.  Respiratory: Effort normal and breath sounds normal. No respiratory distress.  GI: Soft. He exhibits no mass. There is no tenderness. No hepatosplenomegaly. Genitourinary:  Deferred to urologist,no concerns.  Musculoskeletal: He exhibits no edema or tenderness.  No major deformities appreciated and no signs of synovitis.  Lymphadenopathy:    He has no cervical adenopathy.       Right: No supraclavicular adenopathy present.       Left: No supraclavicular adenopathy present.  Neurological: He is alert and oriented to person, place, and time. He has normal strength. No cranial nerve deficit or sensory deficit. Coordination and gait normal.  Reflex  Scores:      Bicep reflexes are 2+ on the right side and 2+ on the left side.      Patellar reflexes are 2+ on the right side and 2+ on the left side. Skin: Skin is warm. No erythema.  Psychiatric: He has a normal mood and affect. Well groomed,good eye contact.    ASSESSMENT AND PLAN:    Mr. Ulas was seen today for follow-up and annual exam.  Diagnoses and all orders for this visit:  Lab Results  Component Value Date   CHOL 147 08/26/2017   HDL 35.50 (L) 08/26/2017   LDLCALC 80 08/26/2017   TRIG 157.0 (H) 08/26/2017   CHOLHDL 4 08/26/2017   Lab Results  Component Value Date   CREATININE 1.06 08/26/2017   BUN 14 08/26/2017   NA 140 08/26/2017   K 4.3 08/26/2017   CL 103 08/26/2017   CO2 30 08/26/2017   Lab Results  Component Value Date   HGBA1C 6.3 08/26/2017   Lab Results  Component Value Date   ALT 13 08/26/2017   AST  13 08/26/2017   ALKPHOS 66 08/26/2017   BILITOT 0.4 08/26/2017    Routine general medical examination at a health care facility  We discussed the importance of regular physical activity and healthy diet for prevention of chronic illness and/or complications. Preventive guidelines reviewed. Vaccination up to date. Next CPE in a year.  The 10-year ASCVD risk score Denman George(Goff [place] Montez HagemanJr., et al., 2013) is: 13.9%   Values used to calculate the score:     Age: [age] years     Sex: Male     Is Non-Hispanic African American: No     Diabetic: No     Tobacco smoker: Yes     Systolic Blood Pressure: 125 mmHg     Is BP treated: Yes     HDL Cholesterol: 35.5 mg/dL     Total Cholesterol: 147 mg/dL  Mixed hyperlipidemia  No changes in current management, will follow labs done today and will give further recommendations accordingly. F/U in 6-12 months.  -     Lipid panel  Essential hypertension  Adequately controlled. No changes in current management. Low salt diet to continue. F/U in 6 months, before if needed.  Diabetes mellitus screening -      Comprehensive metabolic panel -     Hemoglobin A1c  Prostate cancer screening -     PSA(Must document that pt has been informed of limitations of PSA testing.)  Encounter for screening for HIV -     HIV antibody      Return in 6 months (on 02/23/2018) for HTN.      Jahseh Lucchese G. [place], MD  [facility]. [place] office.

## 2017-08-27 ENCOUNTER — Encounter: Payer: Self-pay | Admitting: Family Medicine

## 2017-08-27 LAB — HIV ANTIBODY (ROUTINE TESTING W REFLEX): HIV 1&2 Ab, 4th Generation: NONREACTIVE

## 2017-08-27 MED ORDER — PRAVASTATIN SODIUM 40 MG PO TABS
40.0000 mg | ORAL_TABLET | Freq: Every day | ORAL | 3 refills | Status: DC
Start: 2017-08-27 — End: 2018-07-17

## 2017-08-27 MED ORDER — LISINOPRIL 20 MG PO TABS: 20.0000 mg | ORAL_TABLET | Freq: Every day | ORAL | 2 refills | Status: DC

## 2017-09-04 ENCOUNTER — Telehealth: Payer: Self-pay | Admitting: *Deleted

## 2017-09-04 NOTE — Telephone Encounter (Signed)
I see [employer] insurance card scanned into system, but primary coverage showing as none.  Was this visit billed to his insurance? If not, can we re-file? Thanks.

## 2017-09-04 NOTE — Telephone Encounter (Signed)
Spoke w/ patient, his member ID changed this year, but he has not yet received new card.  New information is:  Member ID: [id number] Group: [id number]  Please update insurance information. Let me know when it is completed and I will send to billing to re-file. Thanks!

## 2017-09-04 NOTE — Telephone Encounter (Signed)
Updated the pts insurance information in the system with the information that was provided to me and it will be refiled

## 2017-09-04 NOTE — Telephone Encounter (Signed)
Copied from CRM [id number]. Topic: Bill or Statement - Patient/Guarantor Payment >> Sep 03, 2017  5:32 PM [name], Steven Cook wrote: Patient name/MRN/Acct #: [id number] DOS: 1.16.19 Additional Information: pt states he went for his yearly cpe for his job and is now having a bill for $340, contact pt to advise  Route to appropriate Pro[employer] pool.

## 2017-09-04 NOTE — Telephone Encounter (Signed)
Can you have registration add the insurance and verify for the dos?  Once this is done, I will have filed to insurance. Thanks! Dawn

## 2017-09-16 ENCOUNTER — Other Ambulatory Visit: Payer: Self-pay | Admitting: Internal Medicine

## 2018-02-02 ENCOUNTER — Other Ambulatory Visit: Payer: Self-pay | Admitting: Family Medicine

## 2018-02-02 DIAGNOSIS — K219 Gastro-esophageal reflux disease without esophagitis: Secondary | ICD-10-CM

## 2018-02-02 NOTE — Telephone Encounter (Signed)
Sent to the pharmacy by e-scribe. 

## 2018-02-22 NOTE — Progress Notes (Signed)
[name] is a 62 y.o.male, who is here today to follow on HTN.  Currently he is on lisinopril 20 mg daily. He is taking medications as instructed, no side effects reported.  He has not noted unusual headache, visual changes, exertional chest pain, dyspnea,  focal weakness, or edema.  Last eye exam over a year ago. She is not checking BP at home.  Lab Results  Component Value Date   CREATININE 1.06 08/26/2017   BUN 14 08/26/2017   NA 140 08/26/2017   K 4.3 08/26/2017   CL 103 08/26/2017   CO2 30 08/26/2017    He would like PSA recheck because he is exposed to "chemicals" at work. He denies any changes in urine frequency. Nocturia just if he drinks a lot of fluids during the day. He has not noted gross hematuria, foam in the urine, or decreased urine output.  Reviewing records, he has history of enlarged prostate in 07/2016.   Lab Results  Component Value Date   PSA 2.12 08/26/2017   According to patient, his wife would like for him to have testosterone checked. No history of ED. Some decrease in sex drive for a few months. Negative for depression, unusual fatigue, or nipple discharge. He feels tired sometimes, depending on working hours.  Still smoking, a pack of cigarettes last 3 to 4 days.   Review of Systems  Constitutional: Positive for fatigue. Negative for activity change, appetite change and fever.  HENT: Negative for mouth sores, nosebleeds, sore throat and trouble swallowing.   Eyes: Negative for redness and visual disturbance.  Respiratory: Negative for cough, shortness of breath and wheezing.   Cardiovascular: Negative for chest pain, palpitations and leg swelling.  Gastrointestinal: Negative for abdominal pain, nausea and vomiting.  Endocrine: Negative for cold intolerance and heat intolerance.  Genitourinary: Negative for decreased urine volume, dysuria and hematuria.  Musculoskeletal: Negative for gait problem and myalgias.  Skin:  Negative for pallor and rash.  Neurological: Negative for syncope, weakness and headaches.  Psychiatric/Behavioral: Negative for confusion. The patient is not nervous/anxious.      Current Outpatient Medications on File Prior to Visit  Medication Sig Dispense Refill  . lisinopril (PRINIVIL,ZESTRIL) 20 MG tablet Take 1 tablet (20 mg total) by mouth daily. 90 tablet 2  . omeprazole (PRILOSEC) 20 MG capsule TAKE 1 CAPSULE (20 MG TOTAL) BY MOUTH DAILY. 90 capsule 1  . pravastatin (PRAVACHOL) 40 MG tablet Take 1 tablet (40 mg total) by mouth daily. 90 tablet 3   No current facility-administered medications on file prior to visit.      Past Medical History:  Diagnosis Date  . Allergy   . GERD (gastroesophageal reflux disease)   . Hyperlipemia   . Hypertension   . Pneumonia     Allergies  Allergen Reactions  . Aspirin Shortness Of Breath  . Nsaids Anaphylaxis    Social History   Socioeconomic History  . Marital status: Married    Spouse name: Not on file  . Number of children: Not on file  . Years of education: Not on file  . Highest education level: Not on file  Occupational History  . Not on file  Social Needs  . Financial resource strain: Not on file  . Food insecurity:    Worry: Not on file    Inability: Not on file  . Transportation needs:    Medical: Not on file    Non-medical: Not on file  Tobacco Use  .  Smoking status: Current Every Day Smoker    Packs/day: 0.50    Types: Cigarettes  . Smokeless tobacco: Current User  Substance and Sexual Activity  . Alcohol use: Yes    Comment: occasional  . Drug use: No  . Sexual activity: Yes  Lifestyle  . Physical activity:    Days per week: Not on file    Minutes per session: Not on file  . Stress: Not on file  Relationships  . Social connections:    Talks on phone: Not on file    Gets together: Not on file    Attends religious service: Not on file    Active member of club or organization: Not on file     Attends meetings of clubs or organizations: Not on file    Relationship status: Not on file  Other Topics Concern  . Not on file  Social History Narrative  . Not on file    Vitals:   02/23/18 0712  BP: 124/82  Pulse: 74  Resp: 12  Temp: 98.2 F (36.8 C)  SpO2: 96%   Body mass index is 26.29 kg/m.    Physical Exam  Nursing note and vitals reviewed. Constitutional: He is oriented to person, place, and time. He appears well-developed and well-nourished. No distress.  HENT:  Head: Normocephalic and atraumatic.  Mouth/Throat: Oropharynx is clear and moist and mucous membranes are normal.  Eyes: Pupils are equal, round, and reactive to light. Conjunctivae are normal.  Neck: No tracheal deviation present. No thyroid mass and no thyromegaly present.  Cardiovascular: Normal rate and regular rhythm.  No murmur heard. Pulses:      Dorsalis pedis pulses are 2+ on the right side, and 2+ on the left side.  Respiratory: Effort normal and breath sounds normal. No respiratory distress.  GI: Soft. He exhibits no mass. There is no hepatomegaly. There is no tenderness.  Genitourinary: Prostate is enlarged (mild. No nodules appreciated.). Prostate is not tender.  Musculoskeletal: He exhibits no edema or tenderness.  Lymphadenopathy:    He has no cervical adenopathy.  Neurological: He is alert and oriented to person, place, and time. He has normal strength. Gait normal.  Skin: Skin is warm. No erythema.  Psychiatric: He has a normal mood and affect.  Well groomed, good eye contact.    ASSESSMENT AND PLAN:   Steven Cook was seen today for follow-up.  Orders Placed This Encounter  Procedures  . Basic metabolic panel  . Testosterone  . PSA  . TSH    Lab Results  Component Value Date   TSH 0.94 02/23/2018   Lab Results  Component Value Date   CREATININE 1.01 02/23/2018   BUN 16 02/23/2018   NA 138 02/23/2018   K 4.8 02/23/2018   CL 103 02/23/2018   CO2 28 02/23/2018   Lab  Results  Component Value Date   PSA 1.93 02/23/2018   PSA 2.12 08/26/2017    Decreased sex drive  We discussed possible etiologies. Further recommendations will be given according to lab results. If testosterone is low we will bring him back to repeat lab.  -     Testosterone -     PSA -     TSH   Essential hypertension Adequately controlled. No changes in current management. Low-salt diet to continue. Eye exam recommended annually. F/U in 6 months, before if needed.   BPH (benign prostatic hyperplasia) He seems to be asymptomatic. We will check PSA today as he requested. Further recommendations will  be given according to lab results.     Steven Mederos G. [place], MD  [facility]. [place] office.

## 2018-02-23 ENCOUNTER — Ambulatory Visit (INDEPENDENT_AMBULATORY_CARE_PROVIDER_SITE_OTHER): Payer: BLUE CROSS/BLUE SHIELD | Admitting: Family Medicine

## 2018-02-23 ENCOUNTER — Encounter: Payer: Self-pay | Admitting: Family Medicine

## 2018-02-23 VITALS — BP 124/82 | HR 74 | Temp 98.2°F | Resp 12 | Ht 70.0 in | Wt 183.2 lb

## 2018-02-23 DIAGNOSIS — R6882 Decreased libido: Secondary | ICD-10-CM

## 2018-02-23 DIAGNOSIS — N400 Enlarged prostate without lower urinary tract symptoms: Secondary | ICD-10-CM

## 2018-02-23 DIAGNOSIS — I10 Essential (primary) hypertension: Secondary | ICD-10-CM | POA: Diagnosis not present

## 2018-02-23 LAB — BASIC METABOLIC PANEL
BUN: 16 mg/dL (ref 6–23)
CO2: 28 mEq/L (ref 19–32)
Calcium: 9.6 mg/dL (ref 8.4–10.5)
Chloride: 103 mEq/L (ref 96–112)
Creatinine, Ser: 1.01 mg/dL (ref 0.40–1.50)
GFR: 79.96 mL/min (ref >60.00)
Glucose, Bld: 103 mg/dL — ABNORMAL HIGH (ref 70–99)
Potassium: 4.8 mEq/L (ref 3.5–5.1)
Sodium: 138 mEq/L (ref 135–145)

## 2018-02-23 LAB — TSH: TSH: 0.94 u[IU]/mL (ref 0.35–4.50)

## 2018-02-23 LAB — PSA: PSA: 1.93 ng/mL (ref 0.10–4.00)

## 2018-02-23 LAB — TESTOSTERONE: Testosterone: 378.67 ng/dL (ref 300.00–890.00)

## 2018-02-23 NOTE — Assessment & Plan Note (Signed)
He seems to be asymptomatic. We will check PSA today as he requested. Further recommendations will be given according to lab results.

## 2018-02-23 NOTE — Assessment & Plan Note (Signed)
Adequately controlled. No changes in current management. Low salt diet to continue. Eye exam recommended annually. F/U in 6 months, before if needed.  

## 2018-02-23 NOTE — Patient Instructions (Addendum)
A few things to remember from today's visit:   Essential hypertension - Plan: Basic metabolic panel  Decreased sex drive - Plan: Testosterone, PSA, TSH  Benign prostatic hyperplasia without lower urinary tract symptoms - Plan: PSA   Please be sure medication list is accurate. If a new problem present, please set up appointment sooner than planned today.

## 2018-02-27 ENCOUNTER — Encounter: Payer: Self-pay | Admitting: Family Medicine

## 2018-04-05 ENCOUNTER — Other Ambulatory Visit: Payer: Self-pay | Admitting: Family Medicine

## 2018-04-05 DIAGNOSIS — I10 Essential (primary) hypertension: Secondary | ICD-10-CM

## 2018-04-13 ENCOUNTER — Other Ambulatory Visit: Payer: Self-pay

## 2018-04-13 ENCOUNTER — Encounter (HOSPITAL_BASED_OUTPATIENT_CLINIC_OR_DEPARTMENT_OTHER): Payer: Self-pay | Admitting: *Deleted

## 2018-04-13 ENCOUNTER — Emergency Department (HOSPITAL_BASED_OUTPATIENT_CLINIC_OR_DEPARTMENT_OTHER)
Admission: EM | Admit: 2018-04-13 | Discharge: 2018-04-13 | Disposition: A | Discharge status: Home / Self Care | Payer: BLUE CROSS/BLUE SHIELD | Attending: Emergency Medicine | Admitting: Emergency Medicine

## 2018-04-13 DIAGNOSIS — I10 Essential (primary) hypertension: Secondary | ICD-10-CM | POA: Diagnosis not present

## 2018-04-13 DIAGNOSIS — M545 Low back pain, unspecified: Secondary | ICD-10-CM

## 2018-04-13 DIAGNOSIS — F17210 Nicotine dependence, cigarettes, uncomplicated: Secondary | ICD-10-CM | POA: Diagnosis not present

## 2018-04-13 DIAGNOSIS — Z79899 Other long term (current) drug therapy: Secondary | ICD-10-CM | POA: Diagnosis not present

## 2018-04-13 MED ORDER — ACETAMINOPHEN 500 MG PO TABS: 1000.0000 mg | ORAL_TABLET | Freq: Four times a day (QID) | ORAL | 0 refills | Status: AC | PRN

## 2018-04-13 MED ORDER — TRAMADOL HCL 50 MG PO TABS: 50.0000 mg | ORAL_TABLET | Freq: Four times a day (QID) | ORAL | 0 refills | Status: DC | PRN

## 2018-04-13 MED ORDER — ACETAMINOPHEN 500 MG PO TABS
1000.0000 mg | ORAL_TABLET | Freq: Once | ORAL | Status: AC
  Administered 2018-04-13: 1000 mg via ORAL
  Filled 2018-04-13: qty 2

## 2018-04-13 MED ORDER — METHYLPREDNISOLONE 4 MG PO TBPK: ORAL_TABLET | ORAL | 0 refills | Status: DC

## 2018-04-13 MED ORDER — DEXAMETHASONE 6 MG PO TABS
10.0000 mg | ORAL_TABLET | Freq: Once | ORAL | Status: AC
  Administered 2018-04-13: 10 mg via ORAL
  Filled 2018-04-13: qty 1

## 2018-04-13 MED FILL — ACETAMINOPHEN 500 MG TABS: 500 | 13 days supply | Qty: 100 | Fill #0

## 2018-04-13 MED FILL — METHYLPREDNISOLONE 4 MG TAB: 4 | 6 days supply | Qty: 21 | Fill #0

## 2018-04-13 NOTE — ED Provider Notes (Addendum)
[facility] EMERGENCY DEPARTMENT Provider Note   CSN: [id number] Arrival date & time: 04/13/18  1016     History   Chief Complaint Chief Complaint  Patient presents with  . Back Pain    HPI Steven Cook is a 62 y.o. male.  HPI 62 year old male with a history of hypertension, hyper lipidemia, prior herniated disc for which she had surgery with Dr. Newell Coral, presents with concern for back pain.  Patient reports that he was lifting something out of a box for his parents.  Reports since then he has had weakness, left greater than right.  Reports he is still able to ambulate at home. Pain is severe radiating around the lower back towards the front and radiating towards the left groin at times. Denies any numbness, saddle anesthesia, loss control control of bowel, inability to urinate or urinary incontinence.  Denies any trauma, falls, IV drug use, chronic steroid use, history of cancer.  Reports normally if he has back pain, he is able to walk it off, however this time is more severe, similar to when he had a herniated disc 14 years ago and had surgery with Dr. Newell Coral.  Reports he does not like taking medications, and so far has done only warm compresses for his pain.  Past Medical History:  Diagnosis Date  . Allergy   . GERD (gastroesophageal reflux disease)   . Hyperlipemia   . Hypertension   . Pneumonia     Patient Active Problem List   Diagnosis Date Noted  . Tobacco use disorder 02/23/2017  . Hearing loss 02/23/2017  . GERD (gastroesophageal reflux disease) 08/25/2016  . Mixed hyperlipidemia 08/25/2016  . Essential hypertension 08/25/2016  . BPH (benign prostatic hyperplasia) 08/06/2016    Past Surgical History:  Procedure Laterality Date  . BACK SURGERY          Home Medications    Prior to Admission medications   Medication Sig Start Date End Date Taking? Authorizing Provider  acetaminophen (TYLENOL) 500 MG tablet Take 2 tablets (1,000 mg  total) by mouth every 6 (six) hours as needed for up to 10 days. 04/13/18 04/23/18  Alvira Monday, MD  lisinopril (PRINIVIL,ZESTRIL) 20 MG tablet TAKE 1 TABLET BY MOUTH EVERY DAY 04/06/18   [place], Betty G, MD  methylPREDNISolone (MEDROL DOSEPAK) 4 MG TBPK tablet See dosepak taper directions. 04/13/18   Alvira Monday, MD  omeprazole (PRILOSEC) 20 MG capsule TAKE 1 CAPSULE (20 MG TOTAL) BY MOUTH DAILY. 02/02/18   [place], Betty G, MD  pravastatin (PRAVACHOL) 40 MG tablet Take 1 tablet (40 mg total) by mouth daily. 08/27/17   [place], Betty G, MD    Family History Family History  Problem Relation Age of Onset  . Arthritis Mother   . Hyperlipidemia Father   . Hypertension Father   . Diabetes Father   . Diabetes Maternal Grandfather     Social History Social History   Tobacco Use  . Smoking status: Current Every Day Smoker    Packs/day: 0.50    Types: Cigarettes  . Smokeless tobacco: Current User  Substance Use Topics  . Alcohol use: Yes    Comment: occasional  . Drug use: No     Allergies   Aspirin and Nsaids   Review of Systems Review of Systems  Constitutional: Negative for fever.  HENT: Negative for sore throat.   Eyes: Negative for visual disturbance.  Respiratory: Negative for shortness of breath.   Cardiovascular: Negative for chest pain.  Gastrointestinal: Negative  for nausea and vomiting.  Genitourinary: Negative for decreased urine volume and difficulty urinating.  Musculoskeletal: Positive for back pain. Negative for neck stiffness.  Skin: Negative for rash.  Neurological: Positive for weakness. Negative for syncope, facial asymmetry, numbness and headaches.     Physical Exam Updated Vital Signs BP 140/88 (BP Location: Right Arm)   Pulse 72   Temp 98.4 F (36.9 C) (Oral)   Resp 18   Ht 5\' 10"  (1.778 m)   Wt 81.6 kg   SpO2 99%   BMI 25.83 kg/m   Physical Exam  Constitutional: He is oriented to person, place, and time. He appears well-developed and  well-nourished. No distress.  HENT:  Head: Normocephalic and atraumatic.  Eyes: Conjunctivae and EOM are normal.  Neck: Normal range of motion.  Cardiovascular: Normal rate, regular rhythm, normal heart sounds and intact distal pulses. Exam reveals no gallop and no friction rub.  No murmur heard. Pulmonary/Chest: Effort normal and breath sounds normal. No respiratory distress. He has no wheezes. He has no rales.  Abdominal: Soft. He exhibits no distension. There is no tenderness. There is no guarding.  Musculoskeletal: He exhibits no edema.  Neurological: He is alert and oriented to person, place, and time. No sensory deficit. GCS eye subscore is 4. GCS verbal subscore is 5. GCS motor subscore is 6.  Weakness with hip flexion of left lower extremity, mild weakness with dorsiflexion, appears to have normal flexion/extension of knee, extension of great toe, normal patellar reflexes. Reports pain with flexion of right hip however able to do so against resistance   Skin: Skin is warm and dry. He is not diaphoretic.  Nursing note and vitals reviewed.    ED Treatments / Results  Labs (all labs ordered are listed, but only abnormal results are displayed) Labs Reviewed - No data to display  EKG None  Radiology No results found.  Procedures Procedures (including critical care time)  Medications Ordered in ED Medications  dexamethasone (DECADRON) tablet 10 mg (10 mg Oral Given 04/13/18 1236)  acetaminophen (TYLENOL) tablet 1,000 mg (1,000 mg Oral Given 04/13/18 1235)     Initial Impression / Assessment and Plan / ED Course  I have reviewed the triage vital signs and the nursing notes.  Pertinent labs & imaging results that were available during my care of the patient were reviewed by me and considered in my medical decision making (see chart for details).     62 year old male with a history of hypertension, hyper lipidemia, prior herniated disc for which she had surgery with Dr.  Newell Coral, presents with concern for back pain. Patient denies any urinary retention or overflow incontinence, stool incontinence, saddle anesthesia, fever, IV drug use, trauma, chronic steroid use or immunocompromise and have low suspicion suspicion for cauda equina, fracture, epidural abscess, or vertebral osteomyelitis.  Do have concern for ruptured disc by history and exam, and patient with mild left lower extremity weakness.    Discussed with Dr. Newell Coral, will have patient start steroids and follow up on Friday.  Given medrol and recommend tylenol. Pt declines tramadol rx.  Patient discharged in stable condition with understanding of reasons to return.     Final Clinical Impressions(s) / ED Diagnoses   Final diagnoses:  Acute left-sided low back pain without sciatica    ED Discharge Orders         Ordered    methylPREDNISolone (MEDROL DOSEPAK) 4 MG TBPK tablet     04/13/18 1206    traMADol (ULTRAM) 50  MG tablet  Every 6 hours PRN,   Status:  Discontinued     04/13/18 1206    acetaminophen (TYLENOL) 500 MG tablet  Every 6 hours PRN     04/13/18 1226           Alvira Monday, MD 04/13/18 1610    Alvira Monday, MD 04/16/18 0730

## 2018-04-13 NOTE — ED Triage Notes (Signed)
Pt reports he was helping his parents move heavy boxes on Sunday and has had low back pain since then.

## 2018-04-16 DIAGNOSIS — M546 Pain in thoracic spine: Secondary | ICD-10-CM | POA: Diagnosis not present

## 2018-04-16 DIAGNOSIS — M549 Dorsalgia, unspecified: Secondary | ICD-10-CM | POA: Diagnosis not present

## 2018-04-16 DIAGNOSIS — M5416 Radiculopathy, lumbar region: Secondary | ICD-10-CM | POA: Diagnosis not present

## 2018-04-16 DIAGNOSIS — M5440 Lumbago with sciatica, unspecified side: Secondary | ICD-10-CM | POA: Diagnosis not present

## 2018-04-16 DIAGNOSIS — M4726 Other spondylosis with radiculopathy, lumbar region: Secondary | ICD-10-CM | POA: Diagnosis not present

## 2018-04-16 DIAGNOSIS — M5136 Other intervertebral disc degeneration, lumbar region: Secondary | ICD-10-CM | POA: Diagnosis not present

## 2018-04-22 ENCOUNTER — Other Ambulatory Visit: Payer: Self-pay | Admitting: Neurosurgery

## 2018-04-22 DIAGNOSIS — M5440 Lumbago with sciatica, unspecified side: Secondary | ICD-10-CM

## 2018-04-23 ENCOUNTER — Ambulatory Visit
Admission: RE | Admit: 2018-04-23 | Discharge: 2018-04-23 | Disposition: A | Discharge status: Home / Self Care | Payer: BLUE CROSS/BLUE SHIELD | Source: Ambulatory Visit | Attending: Neurosurgery | Admitting: Neurosurgery

## 2018-04-23 DIAGNOSIS — M5440 Lumbago with sciatica, unspecified side: Secondary | ICD-10-CM

## 2018-04-23 DIAGNOSIS — M5117 Intervertebral disc disorders with radiculopathy, lumbosacral region: Secondary | ICD-10-CM | POA: Diagnosis not present

## 2018-04-23 MED ORDER — GADOBENATE DIMEGLUMINE 529 MG/ML IV SOLN
15.0000 mL | Freq: Once | INTRAVENOUS | Status: AC | PRN
  Administered 2018-04-23: 15 mL via INTRAVENOUS

## 2018-04-26 DIAGNOSIS — M4156 Other secondary scoliosis, lumbar region: Secondary | ICD-10-CM | POA: Diagnosis not present

## 2018-04-26 DIAGNOSIS — M4726 Other spondylosis with radiculopathy, lumbar region: Secondary | ICD-10-CM | POA: Diagnosis not present

## 2018-04-26 DIAGNOSIS — M5126 Other intervertebral disc displacement, lumbar region: Secondary | ICD-10-CM | POA: Diagnosis not present

## 2018-04-26 DIAGNOSIS — M5440 Lumbago with sciatica, unspecified side: Secondary | ICD-10-CM | POA: Diagnosis not present

## 2018-04-27 ENCOUNTER — Other Ambulatory Visit: Payer: BLUE CROSS/BLUE SHIELD

## 2018-06-02 ENCOUNTER — Emergency Department (HOSPITAL_BASED_OUTPATIENT_CLINIC_OR_DEPARTMENT_OTHER): Payer: BLUE CROSS/BLUE SHIELD

## 2018-06-02 ENCOUNTER — Other Ambulatory Visit: Payer: Self-pay

## 2018-06-02 ENCOUNTER — Emergency Department (HOSPITAL_BASED_OUTPATIENT_CLINIC_OR_DEPARTMENT_OTHER)
Admission: EM | Admit: 2018-06-02 | Discharge: 2018-06-02 | Disposition: A | Discharge status: Home / Self Care | Payer: BLUE CROSS/BLUE SHIELD | Attending: Emergency Medicine | Admitting: Emergency Medicine

## 2018-06-02 ENCOUNTER — Encounter (HOSPITAL_BASED_OUTPATIENT_CLINIC_OR_DEPARTMENT_OTHER): Payer: Self-pay | Admitting: Emergency Medicine

## 2018-06-02 DIAGNOSIS — F17210 Nicotine dependence, cigarettes, uncomplicated: Secondary | ICD-10-CM | POA: Diagnosis not present

## 2018-06-02 DIAGNOSIS — Z79899 Other long term (current) drug therapy: Secondary | ICD-10-CM | POA: Insufficient documentation

## 2018-06-02 DIAGNOSIS — M25511 Pain in right shoulder: Secondary | ICD-10-CM | POA: Diagnosis not present

## 2018-06-02 DIAGNOSIS — I10 Essential (primary) hypertension: Secondary | ICD-10-CM | POA: Insufficient documentation

## 2018-06-02 MED ORDER — OXYCODONE-ACETAMINOPHEN 5-325 MG PO TABS
1.0000 | ORAL_TABLET | Freq: Once | ORAL | Status: AC
  Administered 2018-06-02: 1 via ORAL
  Filled 2018-06-02: qty 1

## 2018-06-02 MED ORDER — LIDOCAINE 5 % EX PTCH: 1.0000 | MEDICATED_PATCH | CUTANEOUS | 0 refills | Status: DC

## 2018-06-02 MED ORDER — METHOCARBAMOL 500 MG PO TABS: 500.0000 mg | ORAL_TABLET | Freq: Three times a day (TID) | ORAL | 0 refills | Status: DC | PRN

## 2018-06-02 MED FILL — METHOCARBAMOL 500 MG TABLET: 500 | 10 days supply | Qty: 30 | Fill #0

## 2018-06-02 MED FILL — LIDOCAINE PATCH 5%: 5 | 15 days supply | Qty: 30 | Fill #0

## 2018-06-02 NOTE — ED Triage Notes (Signed)
Pain since Thursday to right shoulder and upper arm, pain at times. Reached up on Thursday  to turn on ceiling fan, and felt something in his arm pull

## 2018-06-02 NOTE — Discharge Instructions (Addendum)
Please read and follow all provided instructions.  You have been seen today for pain to the right shoulder.   Tests performed today include: An x-ray of the affected area - does NOT show any broken bones or dislocations.  Vital signs. See below for your results today.   Home care instructions: -- *PRICE for first 48 hours: Protect -please utilize the sling as needed for comfort, however please be sure to avoid keeping the shoulder entirely still at all times to avoid developing frozen shoulder. Rest Ice- Do not apply ice pack directly to your skin, place towel or similar between your skin and ice/ice pack. Apply ice for 20 min, then remove for 40 min while awake Compression- Wear brace, elastic bandage, splint as directed by your provider Elevate affected extremity above the level of your heart when not walking around for the first 24-48 hours   Medications:  - Robaxin is the muscle relaxer I have prescribed, this is meant to help with muscle tightness. Be aware that this medication may make you drowsy therefore the first time you take this it should be at a time you are in an environment where you can rest. Do not drive or operate heavy machinery when taking this medication. Do not drink alcohol or take other sedating medications with this medicine such as narcotics or benzodiazepines.  - Lidocaine patches- this is a topical patch you may apply directly over area of discomfort once daily.   You make take Tylenol per over the counter dosing with these medications.   We have prescribed you new medication(s) today. Discuss the medications prescribed today with your pharmacist as they can have adverse effects and interactions with your other medicines including over the counter and prescribed medications. Seek medical evaluation if you start to experience new or abnormal symptoms after taking one of these medicines, seek care immediately if you start to experience difficulty breathing, feeling of  your throat closing, facial swelling, or rash as these could be indications of a more serious allergic reaction   Follow-up instructions: Please follow-up with your primary care provider or the provided orthopedic physician (bone specialist)  if you continue to have significant pain in 1 week- you may see Dr. Pearletha Forge (sports medicine physician in [facility]) or Dr. Ave Filter (orthopedic surgeon in [place]). In this case you may have a more severe injury that requires further care.   Return instructions:  Please return if your digits or extremity are numb or tingling, appear gray or blue, or you have severe pain Please return if you have redness or fevers.  Please return to the Emergency Department if you experience worsening symptoms.  Please return if you have any other emergent concerns. Additional Information:  Your vital signs today were: BP (!) 162/98 (BP Location: Left Arm)    Pulse 88    Temp 98.5 F (36.9 C) (Oral)    Resp 16    Ht 5\' 10"  (1.778 m)    Wt 81.6 kg    SpO2 98%    BMI 25.83 kg/m  If your blood pressure (BP) was elevated above 135/85 this visit, please have this repeated by your doctor within one month. ---------------

## 2018-06-02 NOTE — ED Provider Notes (Signed)
[facility] EMERGENCY DEPARTMENT Provider Note   CSN: [id number] Arrival date & time: 06/02/18  1107     History   Chief Complaint Chief Complaint  Patient presents with  . Shoulder Pain    HPI Steven Cook is a 62 y.o. male with a hx of tobacco abuse, HTN, hyperlipidemia, and BPH who presents to the ED with complaint of intermittent right shoulder pain x 6 days. Patient states that prior to onset of pain he was changing light bulbs in the house, he reached up to pull the fan string with the R hand when he noted a pulling sensation wit hsome discomfort to the R shoulder that was mild. He subsequently did some heavy lifting of suitcases the next day. He cannot recall a specific traumatic type injury. States he has had mild to moderate intermittent discomfort to the diffuse shoulder with some radiation into the upper arm. This seemed to worsen last night and become constant. Pain is worse with ROM, somewhat alleviated with ibuprofen, tylenol, and ice. Denies numbness, paresthesias, weakness, change in color, fever, nausea, vomiting, diaphoresis, chest pain, or dyspnea.   Patient's BP noted to be elevated, he has hx of HTN, just took his BP meds PTA.   HPI  Past Medical History:  Diagnosis Date  . Allergy   . GERD (gastroesophageal reflux disease)   . Hyperlipemia   . Hypertension   . Pneumonia     Patient Active Problem List   Diagnosis Date Noted  . Tobacco use disorder 02/23/2017  . Hearing loss 02/23/2017  . GERD (gastroesophageal reflux disease) 08/25/2016  . Mixed hyperlipidemia 08/25/2016  . Essential hypertension 08/25/2016  . BPH (benign prostatic hyperplasia) 08/06/2016    Past Surgical History:  Procedure Laterality Date  . BACK SURGERY          Home Medications    Prior to Admission medications   Medication Sig Start Date End Date Taking? Authorizing Provider  lisinopril (PRINIVIL,ZESTRIL) 20 MG tablet TAKE 1 TABLET BY MOUTH EVERY DAY  04/06/18   [place], Betty G, MD  methylPREDNISolone (MEDROL DOSEPAK) 4 MG TBPK tablet See dosepak taper directions. 04/13/18   Alvira Monday, MD  omeprazole (PRILOSEC) 20 MG capsule TAKE 1 CAPSULE (20 MG TOTAL) BY MOUTH DAILY. 02/02/18   [place], Betty G, MD  pravastatin (PRAVACHOL) 40 MG tablet Take 1 tablet (40 mg total) by mouth daily. 08/27/17   [place], Betty G, MD    Family History Family History  Problem Relation Age of Onset  . Arthritis Mother   . Hyperlipidemia Father   . Hypertension Father   . Diabetes Father   . Diabetes Maternal Grandfather     Social History Social History   Tobacco Use  . Smoking status: Current Every Day Smoker    Packs/day: 0.50    Types: Cigarettes  . Smokeless tobacco: Current User  Substance Use Topics  . Alcohol use: Yes    Comment: occasional  . Drug use: No     Allergies   Aspirin and Nsaids   Review of Systems Review of Systems  Constitutional: Negative for chills and fever.  Respiratory: Negative for shortness of breath.   Cardiovascular: Negative for chest pain.  Gastrointestinal: Negative for nausea and vomiting.  Musculoskeletal: Positive for arthralgias (R shoulder). Negative for joint swelling.  Skin: Negative for color change, rash and wound.  Neurological: Negative for dizziness, weakness, numbness and headaches.     Physical Exam Updated Vital Signs BP (!) 162/98 (BP Location:  Left Arm)   Pulse 88   Temp 98.5 F (36.9 C) (Oral)   Resp 16   Ht 5\' 10"  (1.778 m)   Wt 81.6 kg   SpO2 98%   BMI 25.83 kg/m   Physical Exam  Constitutional: He appears well-developed and well-nourished. He appears distressed (patient appears mildly uncomfortable).  HENT:  Head: Normocephalic and atraumatic.  Eyes: Conjunctivae are normal. Right eye exhibits no discharge. Left eye exhibits no discharge.  Neck: Normal range of motion. No spinous process tenderness present.  NO palpable step off.   Cardiovascular: Normal rate and  regular rhythm.  Pulses:      Radial pulses are 2+ on the right side, and 2+ on the left side.  Pulmonary/Chest: Effort normal and breath sounds normal.  Musculoskeletal:  No obvious deformity, appreciable swelling, ecchymosis, open wounds, significant erythema, or increased warmth Upper extremities: Patient has full active range of motion to the left shoulder as well as bilateral elbows, wrist, and all digits.  Right shoulder with limitation in flexion, abduction, and scaption secondary to pain, he is able to do each of these motions past 90 degrees.  R shoulder: Patient is tender to palpation diffusely throughout the glenohumeral joint.  He is notably tender over the lateral aspect of the right sided trapezius as well as the deltoid muscle.  He has some tenderness over the teres minor, infraspinatus, and supraspinatus.  Some tenderness to the biceps proximally as well.  No other notable areas of tenderness to the upper extremities.  No palpable joint instability.  He is neurovascularly intact distally.  He does have discomfort with empty can test with right upper extremity, however good strength with this. Back: No midline tenderness to palpation.  Neurological: He is alert.  Clear speech. Sensation grossly intact to bilateral upper extremities. 5/5 symmetric grip strength. Able to perform OK sign, thumbs up, and cross 2nd/3rd digits with bilateral upper extremities.   Psychiatric: He has a normal mood and affect. His behavior is normal. Thought content normal.  Nursing note and vitals reviewed.    ED Treatments / Results  Labs (all labs ordered are listed, but only abnormal results are displayed) Labs Reviewed - No data to display  EKG None  Radiology Dg Shoulder Right  Result Date: 06/02/2018 CLINICAL DATA:  Acute right shoulder pain without known injury. EXAM: RIGHT SHOULDER - 2+ VIEW COMPARISON:  None. FINDINGS: There is no evidence of fracture or dislocation. There is no evidence of  arthropathy or other focal bone abnormality. Soft tissues are unremarkable. IMPRESSION: Negative. Electronically Signed   By: Lupita Raider, M.D.   On: 06/02/2018 12:07    Procedures Procedures (including critical care time)  Medications Ordered in ED Medications  oxyCODONE-acetaminophen (PERCOCET/ROXICET) 5-325 MG per tablet 1 tablet (1 tablet Oral Given 06/02/18 1206)     Initial Impression / Assessment and Plan / ED Course  I have reviewed the triage vital signs and the nursing notes.  Pertinent labs & imaging results that were available during my care of the patient were reviewed by me and considered in my medical decision making (see chart for details).    Patient presents to the ED with complaints of pain to the right shoulder s/p heavy lifting and overhead motion. Exam without obvious deformity or open wounds. Patient afebrile, no overlying erythema/warmth- do not suspect septic joint at this time. ROM mildly limited with flexion/abduction/scaption secondary to pain. Tender to palpation to the glenohumeral joint, deltoid, rotator cuff muscles, and  biceps. NVI distally. Given reproducibility without chest pain/dyspnea, doubt acute cardiopulmonary etiology such ad ACS, pulmonary embolism, or dissection. Xray negative for fracture/dislocation. Concern for possible rotator cuff etiology given exam, good strength with empty can test, feel complete tear is less likely. Patient requesting sling which was provided- discussed precautions of adhesive capsulitis. Will provide muscle relaxant- instructed not to drive or operate heavy machinery with this as well as lidoderm patches. PRICE recommended. I discussed results, treatment plan, need for follow-up, and return precautions with the patient and his son at bedside. Provided opportunity for questions, patient and his son confirmed understanding and are in agreement with plan.   BP noted to be elevated in ER, took antihypertensive medication just  prior to arrival, suspect this and pain are contributing factors, do not suspect HTN emergency, patient aware of need for recheck.   Vitals:   06/02/18 1113 06/02/18 1253  BP: (!) 162/98 (!) 152/96  Pulse: 88 90  Resp: 16 20  Temp: 98.5 F (36.9 C)   SpO2: 98% 96%    Final Clinical Impressions(s) / ED Diagnoses   Final diagnoses:  Acute pain of right shoulder    ED Discharge Orders         Ordered    methocarbamol (ROBAXIN) 500 MG tablet  Every 8 hours PRN     06/02/18 1249    lidocaine (LIDODERM) 5 %  Every 24 hours     06/02/18 1249           Myreon Wimer, [place], PA-C 06/02/18 1300    Blane Ohara, MD 06/04/18 1721

## 2018-06-02 NOTE — ED Notes (Signed)
ED Provider at bedside. 

## 2018-07-17 ENCOUNTER — Other Ambulatory Visit: Payer: Self-pay | Admitting: Family Medicine

## 2018-07-17 DIAGNOSIS — E785 Hyperlipidemia, unspecified: Secondary | ICD-10-CM

## 2018-07-21 ENCOUNTER — Other Ambulatory Visit: Payer: Self-pay | Admitting: Family Medicine

## 2018-07-21 DIAGNOSIS — K219 Gastro-esophageal reflux disease without esophagitis: Secondary | ICD-10-CM

## 2018-08-27 ENCOUNTER — Encounter: Payer: Self-pay | Admitting: Family Medicine

## 2018-08-27 ENCOUNTER — Ambulatory Visit (INDEPENDENT_AMBULATORY_CARE_PROVIDER_SITE_OTHER): Payer: BLUE CROSS/BLUE SHIELD | Admitting: Family Medicine

## 2018-08-27 VITALS — BP 122/80 | HR 84 | Temp 98.1°F | Resp 12 | Ht 70.0 in | Wt 185.2 lb

## 2018-08-27 DIAGNOSIS — L290 Pruritus ani: Secondary | ICD-10-CM

## 2018-08-27 DIAGNOSIS — Z130 Encounter for screening for diseases of the blood and blood-forming organs and certain disorders involving the immune mechanism: Secondary | ICD-10-CM | POA: Diagnosis not present

## 2018-08-27 DIAGNOSIS — N400 Enlarged prostate without lower urinary tract symptoms: Secondary | ICD-10-CM

## 2018-08-27 DIAGNOSIS — Z0000 Encounter for general adult medical examination without abnormal findings: Secondary | ICD-10-CM | POA: Diagnosis not present

## 2018-08-27 DIAGNOSIS — E782 Mixed hyperlipidemia: Secondary | ICD-10-CM | POA: Diagnosis not present

## 2018-08-27 DIAGNOSIS — Z23 Encounter for immunization: Secondary | ICD-10-CM | POA: Diagnosis not present

## 2018-08-27 DIAGNOSIS — Z1329 Encounter for screening for other suspected endocrine disorder: Secondary | ICD-10-CM

## 2018-08-27 DIAGNOSIS — I10 Essential (primary) hypertension: Secondary | ICD-10-CM

## 2018-08-27 DIAGNOSIS — Z13228 Encounter for screening for other metabolic disorders: Secondary | ICD-10-CM

## 2018-08-27 DIAGNOSIS — F17200 Nicotine dependence, unspecified, uncomplicated: Secondary | ICD-10-CM

## 2018-08-27 DIAGNOSIS — Z125 Encounter for screening for malignant neoplasm of prostate: Secondary | ICD-10-CM

## 2018-08-27 LAB — PSA: PSA: 2.61 ng/mL (ref 0.10–4.00)

## 2018-08-27 LAB — COMPREHENSIVE METABOLIC PANEL
ALT: 19 U/L (ref 0–53)
AST: 12 U/L (ref 0–37)
Albumin: 4.4 g/dL (ref 3.5–5.2)
Alkaline Phosphatase: 69 U/L (ref 39–117)
BUN: 17 mg/dL (ref 6–23)
CO2: 28 mEq/L (ref 19–32)
Calcium: 10.1 mg/dL (ref 8.4–10.5)
Chloride: 102 mEq/L (ref 96–112)
Creatinine, Ser: 1.04 mg/dL (ref 0.40–1.50)
GFR: 72.61 mL/min (ref >60.00)
Glucose, Bld: 102 mg/dL — ABNORMAL HIGH (ref 70–99)
Potassium: 4.8 mEq/L (ref 3.5–5.1)
Sodium: 137 mEq/L (ref 135–145)
Total Bilirubin: 0.5 mg/dL (ref 0.2–1.2)
Total Protein: 6.7 g/dL (ref 6.0–8.3)

## 2018-08-27 LAB — LIPID PANEL
Cholesterol: 178 mg/dL (ref 0–200)
HDL: 33.4 mg/dL — ABNORMAL LOW (ref >39.00)
LDL Cholesterol: 110 mg/dL — ABNORMAL HIGH (ref 0–99)
NonHDL: 144.66
Total CHOL/HDL Ratio: 5
Triglycerides: 175 mg/dL — ABNORMAL HIGH (ref 0.0–149.0)
VLDL: 35 mg/dL (ref 0.0–40.0)

## 2018-08-27 LAB — HEMOGLOBIN A1C: Hgb A1c MFr Bld: 6.2 % (ref 4.6–6.5)

## 2018-08-27 MED ORDER — HYDROCORTISONE 2.5 % RE CREA: 1.0000 "application " | TOPICAL_CREAM | Freq: Every day | RECTAL | 1 refills | Status: DC | PRN

## 2018-08-27 NOTE — Progress Notes (Signed)
HPI:  [name] is a 63 y.o.male here today for his routine physical examination.  Last CPE: 08/26/17 He lives with his wife.  Regular exercise 3 or more times per week: He is active at work,walks 6-12 miles per week. Following a healthy diet: Yes   Chronic medical problems: hearing loss,wears hearing aids.BPH, HTN,and HLD.  HLD on Pravastatin 40 mg daily.  HTN on Lisinopril 20 mg daily.  Hx of STD's: Denies.  Immunization History  Administered Date(s) Administered  . Influenza, Seasonal, Injecte, Preservative Fre 06/12/2015  . Influenza,inj,Quad PF,6+ Mos 08/26/2016, 08/26/2017, 08/27/2018  . Pneumococcal Polysaccharide-23 08/26/2017  . Tdap 09/04/2011, 09/04/2011  . Zoster 08/27/2018    -Hep C screening done in 08/2016, NR   Last colon cancer screening: Colonoscopy 08/2016. Last prostate ca screening: Follows with urologist, this months.  -Denies high alcohol intake or Hx of illicit drug use. + Tobacco use,he has decreased smoking some since his last CPE.  Today he is c/o rectal pruritus, which he has had for many years. Alleviated by cleaning area with water and soap. No exacerbating factors identified. He mentioned to GI before colonoscopy and according to pt,he was told to follow with PCP.   Review of Systems  Constitutional: Negative for activity change, appetite change, fatigue and fever.  HENT: Positive for hearing loss. Negative for dental problem, nosebleeds, sore throat, trouble swallowing and voice change.   Eyes: Negative for redness and visual disturbance.  Respiratory: Negative for cough, shortness of breath and wheezing.   Cardiovascular: Negative for chest pain, palpitations and leg swelling.  Gastrointestinal: Negative for abdominal pain, blood in stool, nausea and vomiting.  Endocrine: Negative for cold intolerance, heat intolerance, polydipsia, polyphagia and polyuria.  Genitourinary: Negative for decreased urine volume,  dysuria, genital sores, hematuria and testicular pain.  Musculoskeletal: Negative for gait problem and myalgias.  Skin: Negative for color change and rash.  Allergic/Immunologic: Positive for environmental allergies.  Neurological: Negative for syncope, weakness and headaches.  Hematological: Negative for adenopathy. Does not bruise/bleed easily.  Psychiatric/Behavioral: Negative for confusion and sleep disturbance. The patient is not nervous/anxious.   All other systems reviewed and are negative.    Current Outpatient Medications on File Prior to Visit  Medication Sig Dispense Refill  . lisinopril (PRINIVIL,ZESTRIL) 20 MG tablet TAKE 1 TABLET BY MOUTH EVERY DAY 90 tablet 2  . omeprazole (PRILOSEC) 20 MG capsule TAKE 1 CAPSULE BY MOUTH EVERY DAY 90 capsule 1  . pravastatin (PRAVACHOL) 40 MG tablet TAKE 1 TABLET BY MOUTH EVERY DAY 90 tablet 3   No current facility-administered medications on file prior to visit.      Past Medical History:  Diagnosis Date  . Allergy   . GERD (gastroesophageal reflux disease)   . Hyperlipemia   . Hypertension   . Pneumonia     Past Surgical History:  Procedure Laterality Date  . BACK SURGERY      Allergies  Allergen Reactions  . Aspirin Shortness Of Breath    Other reaction(s): Wheezing (ALLERGY/intolerance)  . Nsaids Anaphylaxis    Family History  Problem Relation Age of Onset  . Arthritis Mother   . Hyperlipidemia Father   . Hypertension Father   . Diabetes Father   . Diabetes Maternal Grandfather     Social History   Socioeconomic History  . Marital status: Married    Spouse name: Not on file  . Number of children: Not on file  . Years of education: Not on  file  . Highest education level: Not on file  Occupational History  . Not on file  Social Needs  . Financial resource strain: Not on file  . Food insecurity:    Worry: Not on file    Inability: Not on file  . Transportation needs:    Medical: Not on file     Non-medical: Not on file  Tobacco Use  . Smoking status: Current Every Day Smoker    Packs/day: 0.50    Types: Cigarettes  . Smokeless tobacco: Current User  Substance and Sexual Activity  . Alcohol use: Yes    Comment: occasional  . Drug use: No  . Sexual activity: Yes  Lifestyle  . Physical activity:    Days per week: Not on file    Minutes per session: Not on file  . Stress: Not on file  Relationships  . Social connections:    Talks on phone: Not on file    Gets together: Not on file    Attends religious service: Not on file    Active member of club or organization: Not on file    Attends meetings of clubs or organizations: Not on file    Relationship status: Not on file  Other Topics Concern  . Not on file  Social History Narrative  . Not on file     Vitals:   08/27/18 0802  BP: 122/80  Pulse: 84  Resp: 12  Temp: 98.1 F (36.7 C)  SpO2: 98%   Body mass index is 26.58 kg/m.   Wt Readings from Last 3 Encounters:  08/27/18 185 lb 4 oz (84 kg)  06/02/18 180 lb (81.6 kg)  04/13/18 180 lb (81.6 kg)    Physical Exam  Nursing note and vitals reviewed. Constitutional: He is oriented to person, place, and time. He appears well-developed. No distress.  HENT:  Head: Normocephalic and atraumatic.  Right Ear: Tympanic membrane, external ear and ear canal normal.  Left Ear: Tympanic membrane, external ear and ear canal normal.  Mouth/Throat: Oropharynx is clear and moist and mucous membranes are normal.  Eyes: Pupils are equal, round, and reactive to light. Conjunctivae and EOM are normal.  Neck: Normal range of motion. No tracheal deviation present. No thyromegaly present.  Cardiovascular: Normal rate and regular rhythm.  Occasional extrasystoles are present.  No murmur heard. Pulses:      Dorsalis pedis pulses are 2+ on the right side and 2+ on the left side.  Respiratory: Effort normal and breath sounds normal. No respiratory distress.  GI: Soft. He exhibits  no mass. There is no hepatomegaly. There is no abdominal tenderness.  Genitourinary:    Genitourinary Comments: Refused rectal exam.   Musculoskeletal:        General: No tenderness or edema.     Comments: No signs of synovitis.  Lymphadenopathy:    He has no cervical adenopathy.       Right: No supraclavicular adenopathy present.       Left: No supraclavicular adenopathy present.  Neurological: He is alert and oriented to person, place, and time. He has normal strength. No cranial nerve deficit or sensory deficit. Coordination and gait normal.  Reflex Scores:      Bicep reflexes are 2+ on the right side and 2+ on the left side.      Patellar reflexes are 2+ on the right side and 2+ on the left side. Skin: Skin is warm. No erythema.  Psychiatric: He has a normal mood and affect.  Cognition and memory are normal.     ASSESSMENT AND PLAN:  Mr. Onel was seen today for annual exam.  Orders Placed This Encounter  Procedures  . Flu Vaccine QUAD 36+ mos IM  . Varicella-zoster vaccine subcutaneous  . Comprehensive metabolic panel  . Hemoglobin A1c  . Lipid panel  . PSA(Must document that pt has been informed of limitations of PSA testing.)  . EKG 12-Lead    Lab Results  Component Value Date   CHOL 178 08/27/2018   HDL 33.40 (L) 08/27/2018   LDLCALC 110 (H) 08/27/2018   TRIG 175.0 (H) 08/27/2018   CHOLHDL 5 08/27/2018   Lab Results  Component Value Date   HGBA1C 6.2 08/27/2018   Lab Results  Component Value Date   ALT 19 08/27/2018   AST 12 08/27/2018   ALKPHOS 69 08/27/2018   BILITOT 0.5 08/27/2018   Lab Results  Component Value Date   CREATININE 1.04 08/27/2018   BUN 17 08/27/2018   NA 137 08/27/2018   K 4.8 08/27/2018   CL 102 08/27/2018   CO2 28 08/27/2018   Lab Results  Component Value Date   PSA 2.61 08/27/2018    Routine general medical examination at a health care facility We discussed the importance of regular physical activity and healthy diet for  prevention of chronic illness and/or complications. Preventive guidelines reviewed. Vaccination updated.  Next CPE in a year.  Prostate cancer screening -     PSA(Must document that pt has been informed of limitations of PSA testing.)  Screening for endocrine, metabolic and immunity disorder -     Hemoglobin A1c  Anal pruritus Possible etiologies discussed. Recommend continuing with good hygiene. Topical steroid,small amount daily as needed. Instructed about warning signs.  -     hydrocortisone (ANUSOL-HC) 2.5 % rectal cream; Place 1 application rectally daily as needed for hemorrhoids or anal itching.  Need for immunization against influenza -     Flu Vaccine QUAD 36+ mos IM  Need for shingles vaccine -     Varicella-zoster vaccine subcutaneous   Tobacco use disorder Adverse effects of tobacco use as well as benefits of smoking cessation were discussed. He uses nicotine intermittently and trying to going down on cigarette/day.  Mixed hyperlipidemia For now he will continue pravastatin 40 mg daily. We will adjust treatment if needed according to lipid panel results. Follow-up in 6-12 months.  Essential hypertension BP adequately controlled. No changes in lisinopril 20 mg daily. Eye exam is current. Continue low-salt diet. Follow-up in 6 months.  BPH (benign prostatic hyperplasia) Continue following with urologist. PSA was done today.  EKG done today because a couple of extrasystoles. SR,normal axis intervals. Otherwise no significant changes when compared with EKG 08/2016.    Return in 6 months (on 02/25/2019) for HTN.    Betty G. [place], MD  [facility]. [place] office.

## 2018-08-27 NOTE — Assessment & Plan Note (Signed)
Continue following with urologist. PSA was done today.

## 2018-08-27 NOTE — Patient Instructions (Addendum)
A few things to remember from today's visit:   Routine general medical examination at a health care facility  Tobacco use disorder  Mixed hyperlipidemia - Plan: Comprehensive metabolic panel, Lipid panel  Essential hypertension - Plan: EKG 12-Lead, Comprehensive metabolic panel  Prostate cancer screening - Plan: PSA(Must document that pt has been informed of limitations of PSA testing.)  Screening for endocrine, metabolic and immunity disorder - Plan: Hemoglobin A1c  Anal pruritus - Plan: hydrocortisone (ANUSOL-HC) 2.5 % rectal cream  Rectal hygiene and small amount of hydrocortisone daily as needed.   At least 150 minutes of moderate exercise per week, daily brisk walking for 15-30 min is a good exercise option. Healthy diet low in saturated (animal) fats and sweets and consisting of fresh fruits and vegetables, lean meats such as fish and white chicken and whole grains.  - Vaccines:  Tdap vaccine every 10 years.  Shingles vaccine recommended at age 34, could be given after 61 years of age but not sure about insurance coverage.  Pneumonia vaccines:  Prevnar 13 at 65 and Pneumovax at 38.   -Screening recommendations for low/normal risk males:  Screening for diabetes at age 69 and every 3 years. Earlier screening if cardiovascular risk factors.   Lipid screening at 35 and every 3 years. Screening starts in younger males with cardiovascular risk factors.  Colon cancer screening at age 51 and until age 64.  Prostate cancer screening: some controversy, starts usually at 50: Rectal exam and PSA.  Aortic Abdominal Aneurism once between 59 and 76 years old if ever smoker.  Also recommended:  1. Dental visit- Brush and floss your teeth twice daily; visit your dentist twice a year. 2. Eye doctor- Get an eye exam at least every 2 years. 3. Helmet use- Always wear a helmet when riding a bicycle, motorcycle, rollerblading or skateboarding. 4. Safe sex- If you may be exposed to  sexually transmitted infections, use a condom. 5. Seat belts- Seat belts can save your live; always wear one. 6. Smoke/Carbon Monoxide detectors- These detectors need to be installed on the appropriate level of your home. Replace batteries at least once a year. 7. Skin cancer- When out in the sun please cover up and use sunscreen 15 SPF or higher. 8. Violence- If anyone is threatening or hurting you, please tell your healthcare provider.  9. Drink alcohol in moderation- Limit alcohol intake to one drink or less per day. Never drink and drive.  Please be sure medication list is accurate. If a new problem present, please set up appointment sooner than planned today.

## 2018-08-27 NOTE — Assessment & Plan Note (Signed)
For now he will continue pravastatin 40 mg daily. We will adjust treatment if needed according to lipid panel results. Follow-up in 6-12 months.

## 2018-08-27 NOTE — Assessment & Plan Note (Signed)
BP adequately controlled. No changes in lisinopril 20 mg daily. Eye exam is current. Continue low-salt diet. Follow-up in 6 months.

## 2018-08-27 NOTE — Assessment & Plan Note (Signed)
Adverse effects of tobacco use as well as benefits of smoking cessation were discussed. He uses nicotine intermittently and trying to going down on cigarette/day.

## 2018-08-29 ENCOUNTER — Encounter: Payer: Self-pay | Admitting: Family Medicine

## 2018-08-30 ENCOUNTER — Other Ambulatory Visit: Payer: Self-pay | Admitting: *Deleted

## 2018-08-30 MED ORDER — ATORVASTATIN CALCIUM 40 MG PO TABS: 40.0000 mg | ORAL_TABLET | Freq: Every day | ORAL | 3 refills | Status: DC

## 2019-01-11 ENCOUNTER — Other Ambulatory Visit: Payer: Self-pay | Admitting: Family Medicine

## 2019-01-11 DIAGNOSIS — I10 Essential (primary) hypertension: Secondary | ICD-10-CM

## 2019-01-11 DIAGNOSIS — K219 Gastro-esophageal reflux disease without esophagitis: Secondary | ICD-10-CM

## 2019-02-25 ENCOUNTER — Other Ambulatory Visit: Payer: Self-pay

## 2019-02-25 ENCOUNTER — Encounter: Payer: Self-pay | Admitting: Family Medicine

## 2019-02-25 ENCOUNTER — Ambulatory Visit (INDEPENDENT_AMBULATORY_CARE_PROVIDER_SITE_OTHER): Payer: BC Managed Care – PPO | Admitting: Family Medicine

## 2019-02-25 VITALS — BP 124/82 | HR 68 | Temp 98.1°F | Resp 12 | Ht 70.0 in | Wt 177.2 lb

## 2019-02-25 DIAGNOSIS — K219 Gastro-esophageal reflux disease without esophagitis: Secondary | ICD-10-CM | POA: Diagnosis not present

## 2019-02-25 DIAGNOSIS — I10 Essential (primary) hypertension: Secondary | ICD-10-CM

## 2019-02-25 DIAGNOSIS — E782 Mixed hyperlipidemia: Secondary | ICD-10-CM

## 2019-02-25 DIAGNOSIS — R7303 Prediabetes: Secondary | ICD-10-CM | POA: Diagnosis not present

## 2019-02-25 DIAGNOSIS — F17200 Nicotine dependence, unspecified, uncomplicated: Secondary | ICD-10-CM

## 2019-02-25 LAB — BASIC METABOLIC PANEL
BUN: 21 mg/dL (ref 6–23)
CO2: 27 mEq/L (ref 19–32)
Calcium: 9.4 mg/dL (ref 8.4–10.5)
Chloride: 102 mEq/L (ref 96–112)
Creatinine, Ser: 1.13 mg/dL (ref 0.40–1.50)
GFR: 65.87 mL/min (ref >60.00)
Glucose, Bld: 88 mg/dL (ref 70–99)
Potassium: 4.1 mEq/L (ref 3.5–5.1)
Sodium: 138 mEq/L (ref 135–145)

## 2019-02-25 LAB — LIPID PANEL
Cholesterol: 115 mg/dL (ref 0–200)
HDL: 32.6 mg/dL — ABNORMAL LOW (ref >39.00)
LDL Cholesterol: 55 mg/dL (ref 0–99)
NonHDL: 82.87
Total CHOL/HDL Ratio: 4
Triglycerides: 137 mg/dL (ref 0.0–149.0)
VLDL: 27.4 mg/dL (ref 0.0–40.0)

## 2019-02-25 LAB — HEMOGLOBIN A1C: Hgb A1c MFr Bld: 6.1 % (ref 4.6–6.5)

## 2019-02-25 NOTE — Patient Instructions (Signed)
A few things to remember from today's visit:   Mixed hyperlipidemia - Plan: Lipid panel  Essential hypertension - Plan: Basic metabolic panel  Prediabetes - Plan: Hemoglobin A1c  Gastroesophageal reflux disease, esophagitis presence not specified  Tobacco use disorder  No changes today.   Please be sure medication list is accurate. If a new problem present, please set up appointment sooner than planned today.

## 2019-02-25 NOTE — Assessment & Plan Note (Signed)
Continue a healthy lifestyle for primary prevention. We discussed diagnostic criteria for diabetes. Further recommendation will be given according to lab results.

## 2019-02-25 NOTE — Assessment & Plan Note (Signed)
Problem is well controlled. Continue omeprazole 20 mg daily. GERD precautions also recommended. 

## 2019-02-25 NOTE — Progress Notes (Signed)
[name] is a 63 y.o.male, who is here today for chronic disease management.  He was last seen on 08/27/18 for his CPE. He has not had new problems since his last visit.  HTN: Last follow up visit: 08/27/18. Currently on lisinopril 20 mg daily.Marland Kitchen. He is taking medications as instructed, no side effects reported.  He has not noted unusual headache, visual changes, exertional chest pain, dyspnea,  focal weakness, or edema. Home BP readings: Not checking.   Lab Results  Component Value Date   CREATININE 1.04 08/27/2018   BUN 17 08/27/2018   NA 137 08/27/2018   K 4.8 08/27/2018   CL 102 08/27/2018   CO2 28 08/27/2018   Prediabetes: Denies abdominal pain, nausea,vomiting, polydipsia,polyuria, or polyphagia.  Lab Results  Component Value Date   HGBA1C 6.2 08/27/2018   HLD: Started on Atorvastatin 40 mg last visit. He has tolerated medication well. He is following a low-fat diet consistently.  Lab Results  Component Value Date   CHOL 178 08/27/2018   HDL 33.40 (L) 08/27/2018   LDLCALC 110 (H) 08/27/2018   TRIG 175.0 (H) 08/27/2018   CHOLHDL 5 08/27/2018   GERD, currently he is on omeprazole 20 mg daily. Negative for changes in bowel habits, blood in the stool, melena.  Smoking more for the past few months, since COVID- 19 pandemia started. He has not noted cough, wheezing, or exertional dyspnea.   Review of Systems  Constitutional: Negative for appetite change, chills, fatigue and fever.  HENT: Negative for nosebleeds and sore throat.   Genitourinary: Negative for decreased urine volume and hematuria.  Neurological: Negative for syncope and facial asymmetry.  Rest see pertinent positives and negatives per HPI.  Current Outpatient Medications on File Prior to Visit  Medication Sig Dispense Refill  . atorvastatin (LIPITOR) 40 MG tablet Take 1 tablet (40 mg total) by mouth daily. 90 tablet 3  . hydrocortisone (ANUSOL-HC) 2.5 % rectal cream Place 1  application rectally daily as needed for hemorrhoids or anal itching. 30 g 1  . lisinopril (ZESTRIL) 20 MG tablet TAKE 1 TABLET BY MOUTH EVERY DAY 90 tablet 0  . omeprazole (PRILOSEC) 20 MG capsule TAKE 1 CAPSULE BY MOUTH EVERY DAY 90 capsule 0   No current facility-administered medications on file prior to visit.      Past Medical History:  Diagnosis Date  . Allergy   . GERD (gastroesophageal reflux disease)   . Hyperlipemia   . Hypertension   . Pneumonia     Allergies  Allergen Reactions  . Aspirin Shortness Of Breath    Other reaction(s): Wheezing (ALLERGY/intolerance)  . Nsaids Anaphylaxis    Social History   Socioeconomic History  . Marital status: Married    Spouse name: Not on file  . Number of children: Not on file  . Years of education: Not on file  . Highest education level: Not on file  Occupational History  . Not on file  Social Needs  . Financial resource strain: Not on file  . Food insecurity    Worry: Not on file    Inability: Not on file  . Transportation needs    Medical: Not on file    Non-medical: Not on file  Tobacco Use  . Smoking status: Current Every Day Smoker    Packs/day: 0.50    Types: Cigarettes  . Smokeless tobacco: Current User  Substance and Sexual Activity  . Alcohol use: Yes    Comment: occasional  .  Drug use: No  . Sexual activity: Yes  Lifestyle  . Physical activity    Days per week: Not on file    Minutes per session: Not on file  . Stress: Not on file  Relationships  . Social Herbalist on phone: Not on file    Gets together: Not on file    Attends religious service: Not on file    Active member of club or organization: Not on file    Attends meetings of clubs or organizations: Not on file    Relationship status: Not on file  Other Topics Concern  . Not on file  Social History Narrative  . Not on file    Vitals:   02/25/19 0810  BP: 124/82  Pulse: 68  Resp: 12  Temp: 98.1 F (36.7 C)  SpO2:  98%   Body mass index is 25.43 kg/m.   Physical Exam  Nursing note and vitals reviewed. Constitutional: He is oriented to person, place, and time. He appears well-developed and well-nourished. No distress.  HENT:  Head: Normocephalic and atraumatic.  Mouth/Throat: Oropharynx is clear and moist and mucous membranes are normal.  Eyes: Pupils are equal, round, and reactive to light. Conjunctivae are normal.  Cardiovascular: Normal rate and regular rhythm.  No murmur heard. Pulses:      Dorsalis pedis pulses are 2+ on the right side and 2+ on the left side.  Respiratory: Effort normal and breath sounds normal. No respiratory distress.  GI: Soft. He exhibits no mass. There is no hepatomegaly. There is no abdominal tenderness.  Musculoskeletal:        General: No edema.  Lymphadenopathy:    He has no cervical adenopathy.  Neurological: He is alert and oriented to person, place, and time. He has normal strength. No cranial nerve deficit. Gait normal.  Skin: Skin is warm. No rash noted. No erythema.  Psychiatric: He has a normal mood and affect.  Well groomed, good eye contact.   ASSESSMENT AND PLAN:    [name] is here today for HTN follow up.  Orders Placed This Encounter  Procedures  . Basic metabolic panel  . Hemoglobin A1c  . Lipid panel    Lab Results  Component Value Date   HGBA1C 6.1 02/25/2019   Lab Results  Component Value Date   CREATININE 1.13 02/25/2019   BUN 21 02/25/2019   NA 138 02/25/2019   K 4.1 02/25/2019   CL 102 02/25/2019   CO2 27 02/25/2019   Lab Results  Component Value Date   CHOL 115 02/25/2019   HDL 32.60 (L) 02/25/2019   LDLCALC 55 02/25/2019   TRIG 137.0 02/25/2019   CHOLHDL 4 02/25/2019    Tobacco use disorder Educated about adverse effects of tobacco use as well as benefits of smoking cessation. At this time he is not interested in trying medication for smoking cessation.  Prediabetes Continue a healthy lifestyle  for primary prevention. We discussed diagnostic criteria for diabetes. Further recommendation will be given according to lab results.  Mixed hyperlipidemia Continue atorvastatin 40 mg daily. We will follow labs done today and will give further recommendations accordingly.   GERD (gastroesophageal reflux disease) Problem is well controlled. Continue omeprazole 20 mg daily. GERD precautions also recommended.  Essential hypertension BP adequately controlled. Continue lisinopril 20 mg daily. Monitor BP regularly at home. Low salt diet.   Return in about 6 months (around 08/30/2019) for cpe.  -[name] advised to  return sooner than planned today if new concerns arise.     Betty G. [place], MD  [facility]. [place] office.

## 2019-02-25 NOTE — Assessment & Plan Note (Signed)
BP adequately controlled. Continue lisinopril 20 mg daily. Monitor BP regularly at home. Low salt diet.

## 2019-02-25 NOTE — Assessment & Plan Note (Signed)
Educated about adverse effects of tobacco use as well as benefits of smoking cessation. At this time he is not interested in trying medication for smoking cessation.

## 2019-02-25 NOTE — Assessment & Plan Note (Signed)
Continue atorvastatin 40 mg daily. We will follow labs done today and will give further recommendations accordingly.

## 2019-03-01 ENCOUNTER — Encounter: Payer: Self-pay | Admitting: Family Medicine

## 2019-03-01 MED ORDER — LISINOPRIL 20 MG PO TABS: 20.0000 mg | ORAL_TABLET | Freq: Every day | ORAL | 2 refills | Status: DC

## 2019-04-10 ENCOUNTER — Other Ambulatory Visit: Payer: Self-pay | Admitting: Family Medicine

## 2019-04-10 DIAGNOSIS — K219 Gastro-esophageal reflux disease without esophagitis: Secondary | ICD-10-CM

## 2019-07-22 ENCOUNTER — Other Ambulatory Visit: Payer: Self-pay | Admitting: Family Medicine

## 2019-07-22 DIAGNOSIS — K219 Gastro-esophageal reflux disease without esophagitis: Secondary | ICD-10-CM

## 2019-08-08 IMAGING — MR MR LUMBAR SPINE WO/W CM
4 of 8 series · 21 of 48 positions shown · IV contrast (15ml multihance)
Comparison: Radiography 04/18/2018.  MRI 04/19/2009.

CLINICAL DATA: Low back pain radiating to the left leg over the
last 2 weeks. Some numbness and weakness. Previous surgery 5227.

Creatinine was obtained on site at [HOSPITAL] at [HOSPITAL].
Results: Creatinine 1.1 mg/dL.
EXAM:
MRI LUMBAR SPINE WITHOUT AND WITH CONTRAST
TECHNIQUE: Multiplanar and multiecho pulse sequences of the lumbar spine were
obtained without and with intravenous contrast.
CONTRAST:  15mL MULTIHANCE GADOBENATE DIMEGLUMINE 529 MG/ML IV SOLN

[Series 3: T1 · sagittal · 4.0mm · 0.88mm/px · 5 of 18 slices shown (1 of 2)]
[im 1/18]
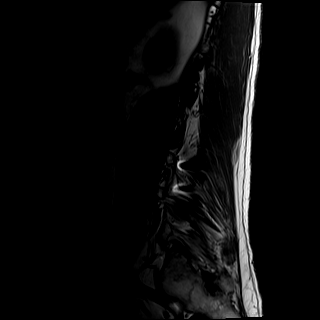
[im 5/18]
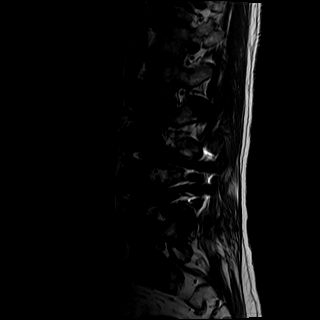
[im 9/18]
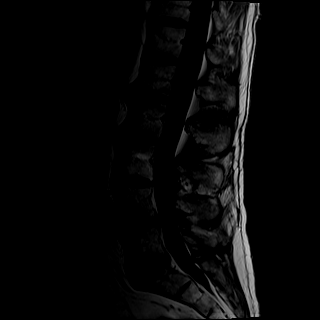
[im 13/18]
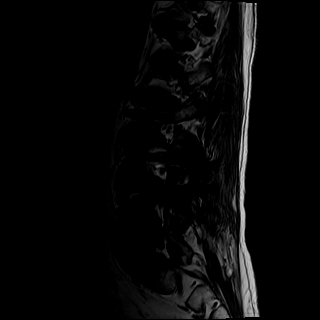
[im 18/18]
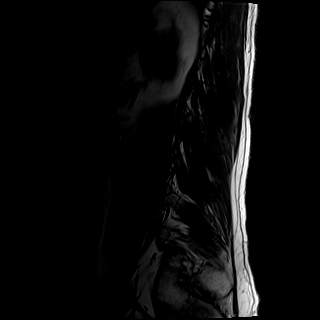

[Series 5: T2 · axial · 4.0mm · 0.39mm/px · z∈[-114,+119]mm · 9 of 43 slices shown (1 of 2)]
[im 1/43]
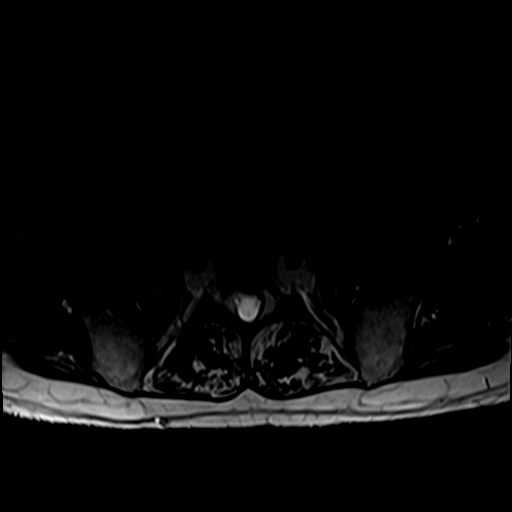
[im 6/43]
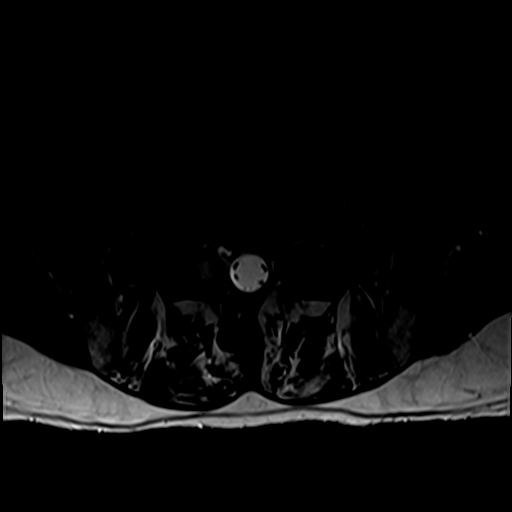
[im 11/43]
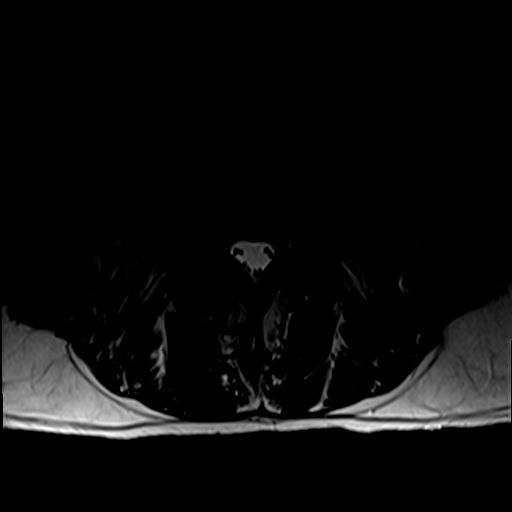
[im 16/43]
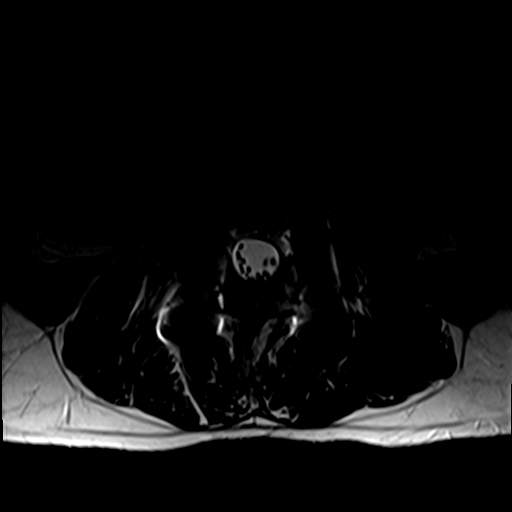
[im 22/43]
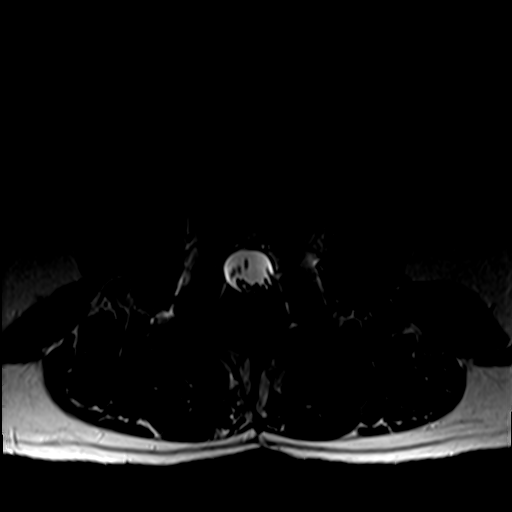
[im 27/43]
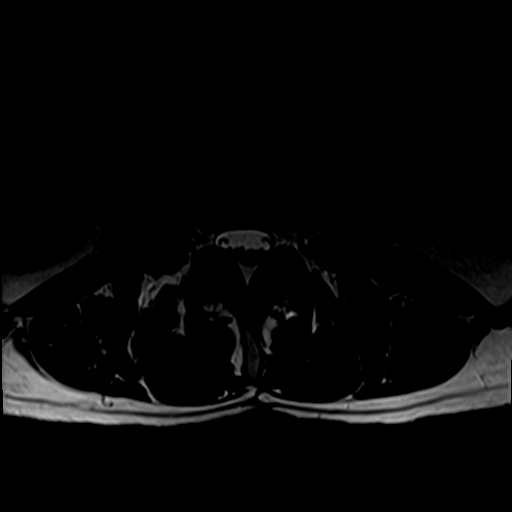
[im 32/43]
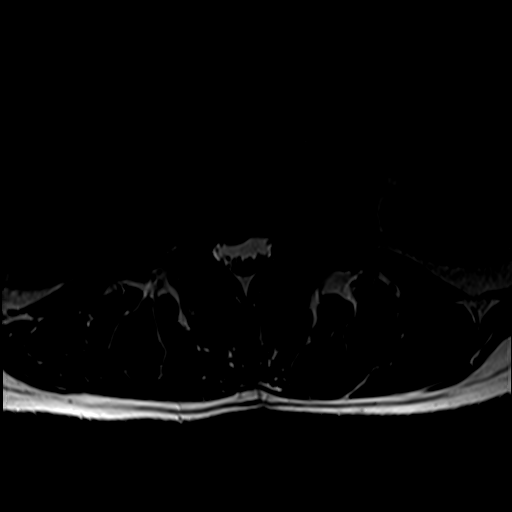
[im 37/43]
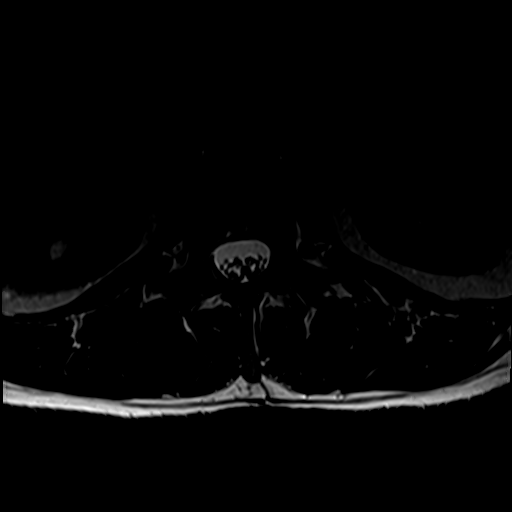
[im 43/43]
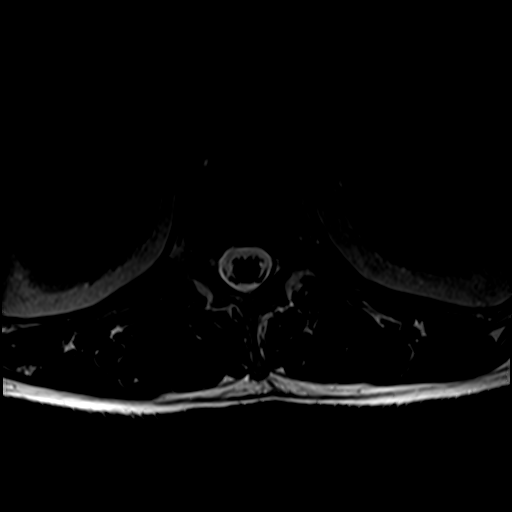

[Series 6: T1 · axial · 4.0mm · 0.39mm/px · z∈[-91,+90]mm · 3 of 43 slices shown (2 of 2)]
[im 6/43]
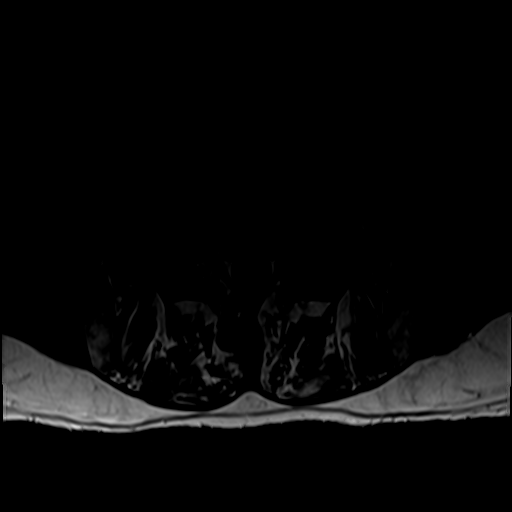
[im 22/43]
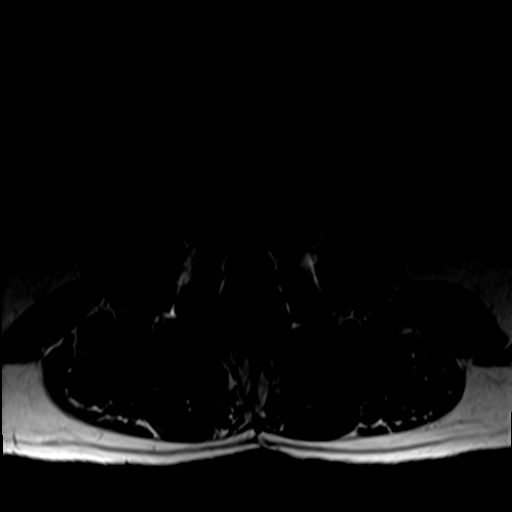
[im 37/43]
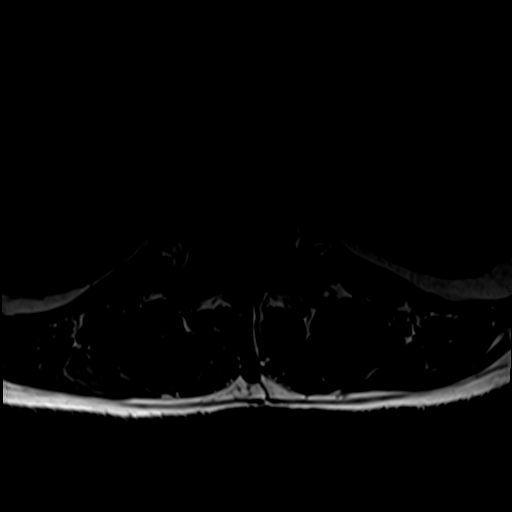

[Series 7: T2 · sagittal · 4.0mm · 0.55mm/px · 4 of 18 slices shown (2 of 2)]
[im 1/18]
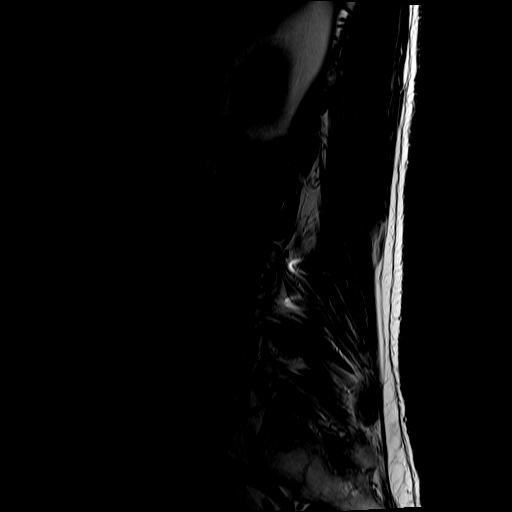
[im 6/18]
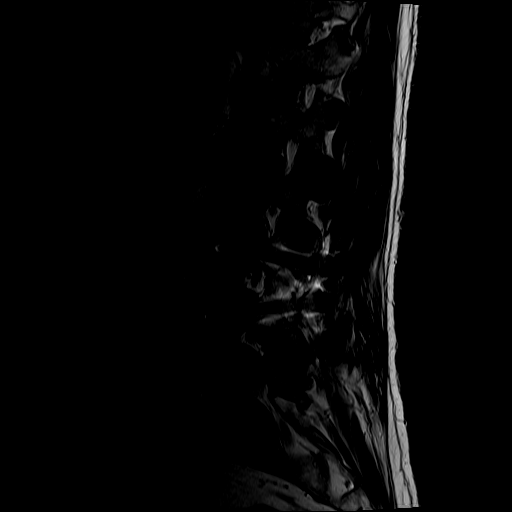
[im 12/18]
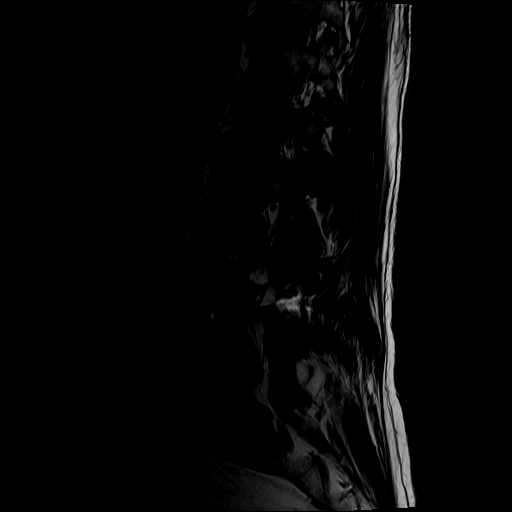
[im 18/18]
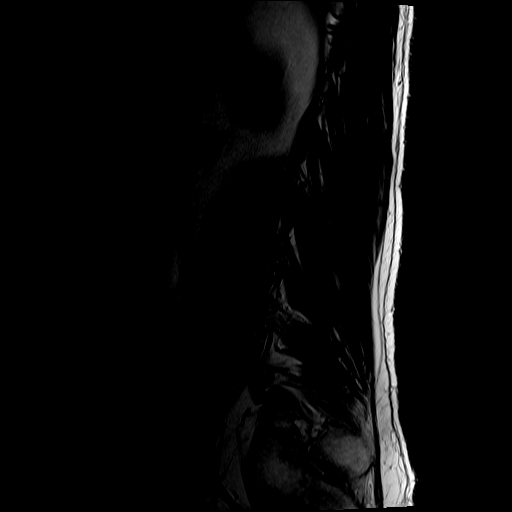

[21 of 48 positions shown; findings below may reference images not displayed]

FINDINGS: Segmentation:  5 lumbar type vertebral bodies.

Alignment:  2 mm anterolisthesis L4-5.

Vertebrae: Previous decompression and fusion procedure at L3-4. No
other acute or focal bone finding.

Conus medullaris and cauda equina: Conus extends to the L1 level.
Conus and cauda equina appear normal.

Paraspinal and other soft tissues: Negative

Disc levels:

T12-L1 remains normal.

L1-2: Broad-based disc herniation with caudal migration in the
midline that indents the thecal sac but does not cause definite
neural compression. There is left foraminal to extraforaminal
encroachment that could possibly affect the left L1 nerve.

L2-3: Disc bulge.  Mild facet hypertrophy.  No compressive stenosis.

L3-4: Previous posterior decompression, diskectomy and fusion.
Fusion appears to be solid. No sign of motion or stenosis.

L4-5: Disc bulge. Facet and ligamentous hypertrophy. No compressive
stenosis. The facet arthropathy could be symptomatic. This has
worsened since the previous study.

L5-S1: Shallow disc protrusion which does not have any affect upon
the thecal sac or nerve roots. No change since the previous study.
IMPRESSION: Distant fusion and decompression procedure at L3-4 has a good
appearance. Wide patency of the canal and foramina.

New disc herniation at L1-2 with caudal migration of disc material
in the midline that indents the thecal sac but does not appear to
cause neural compression. Left foraminal to extraforaminal extension
that could affect the left L1 nerve.

L4-5 disc bulge and facet osteoarthritis. No compressive stenosis.
The facet disease could be symptomatic, and is progressive since
2515.

L5-S1 shallow disc herniation which is chronic and does not appear
to affect the neural structures.

## 2019-08-29 ENCOUNTER — Encounter: Payer: Self-pay | Admitting: Family Medicine

## 2019-08-29 ENCOUNTER — Ambulatory Visit (INDEPENDENT_AMBULATORY_CARE_PROVIDER_SITE_OTHER): Payer: BC Managed Care – PPO | Admitting: Family Medicine

## 2019-08-29 ENCOUNTER — Other Ambulatory Visit: Payer: Self-pay

## 2019-08-29 VITALS — BP 126/80 | HR 74 | Temp 96.3°F | Resp 16 | Ht 70.0 in | Wt 184.0 lb

## 2019-08-29 DIAGNOSIS — I10 Essential (primary) hypertension: Secondary | ICD-10-CM | POA: Diagnosis not present

## 2019-08-29 DIAGNOSIS — E782 Mixed hyperlipidemia: Secondary | ICD-10-CM

## 2019-08-29 DIAGNOSIS — Z0000 Encounter for general adult medical examination without abnormal findings: Secondary | ICD-10-CM

## 2019-08-29 DIAGNOSIS — Z23 Encounter for immunization: Secondary | ICD-10-CM | POA: Diagnosis not present

## 2019-08-29 DIAGNOSIS — Z130 Encounter for screening for diseases of the blood and blood-forming organs and certain disorders involving the immune mechanism: Secondary | ICD-10-CM

## 2019-08-29 DIAGNOSIS — Z125 Encounter for screening for malignant neoplasm of prostate: Secondary | ICD-10-CM | POA: Diagnosis not present

## 2019-08-29 DIAGNOSIS — Z13228 Encounter for screening for other metabolic disorders: Secondary | ICD-10-CM | POA: Diagnosis not present

## 2019-08-29 DIAGNOSIS — Z1329 Encounter for screening for other suspected endocrine disorder: Secondary | ICD-10-CM | POA: Diagnosis not present

## 2019-08-29 DIAGNOSIS — I499 Cardiac arrhythmia, unspecified: Secondary | ICD-10-CM | POA: Diagnosis not present

## 2019-08-29 LAB — LIPID PANEL
Cholesterol: 129 mg/dL (ref 0–200)
HDL: 37.4 mg/dL — ABNORMAL LOW (ref >39.00)
LDL Cholesterol: 59 mg/dL (ref 0–99)
NonHDL: 91.82
Total CHOL/HDL Ratio: 3
Triglycerides: 162 mg/dL — ABNORMAL HIGH (ref 0.0–149.0)
VLDL: 32.4 mg/dL (ref 0.0–40.0)

## 2019-08-29 LAB — COMPREHENSIVE METABOLIC PANEL
ALT: 18 U/L (ref 0–53)
AST: 14 U/L (ref 0–37)
Albumin: 4.4 g/dL (ref 3.5–5.2)
Alkaline Phosphatase: 84 U/L (ref 39–117)
BUN: 17 mg/dL (ref 6–23)
CO2: 28 mEq/L (ref 19–32)
Calcium: 9.7 mg/dL (ref 8.4–10.5)
Chloride: 102 mEq/L (ref 96–112)
Creatinine, Ser: 1 mg/dL (ref 0.40–1.50)
GFR: 75.72 mL/min (ref >60.00)
Glucose, Bld: 108 mg/dL — ABNORMAL HIGH (ref 70–99)
Potassium: 4.7 mEq/L (ref 3.5–5.1)
Sodium: 137 mEq/L (ref 135–145)
Total Bilirubin: 0.5 mg/dL (ref 0.2–1.2)
Total Protein: 6.5 g/dL (ref 6.0–8.3)

## 2019-08-29 LAB — HEMOGLOBIN A1C: Hgb A1c MFr Bld: 6.3 % (ref 4.6–6.5)

## 2019-08-29 LAB — PSA: PSA: 2.28 ng/mL (ref 0.10–4.00)

## 2019-08-29 MED ORDER — ATORVASTATIN CALCIUM 40 MG PO TABS: 40.0000 mg | ORAL_TABLET | Freq: Every day | ORAL | 3 refills | Status: DC

## 2019-08-29 MED ORDER — LISINOPRIL 20 MG PO TABS: 20.0000 mg | ORAL_TABLET | Freq: Every day | ORAL | 2 refills | Status: DC

## 2019-08-29 NOTE — Assessment & Plan Note (Signed)
BP adequately controlled. Continue lisinopril 20 mg. Continue low-salt diet.

## 2019-08-29 NOTE — Progress Notes (Signed)
HPI:  [name] is a 64 y.o.male here today for his routine physical examination.  Last CPE: 08/27/2018. He lives with his wife.  Regular exercise 3 or more times per week: He walks a lot through the day, 5-8 miles daily, active job. Following a healthy diet: In general he does but he usually eats more during winter, so gained some weight.  Chronic medical problems: Hypertension, hyperlipidemia, prediabetes, BPH, GERD,and hearing loss.  Hx of STD's: Negative  Immunization History  Administered Date(s) Administered  . Influenza, Seasonal, Injecte, Preservative Fre 06/12/2015  . Influenza,inj,Quad PF,6+ Mos 08/26/2016, 08/26/2017, 08/27/2018  . Pneumococcal Polysaccharide-23 08/26/2017  . Tdap 09/04/2011, 09/04/2011  . Zoster 08/27/2018   -Hep C screening: 08/2016, NR.  Last colon cancer screening: 08/2016. Last prostate ca screening: 2.6 on 08/27/2018.  He has followed with urologist, did not this past year. Nocturia x 0.  -Denies high alcohol intake or Hx of illicit drug use. Tobacco use since age 64 to date, no more than one half a pack per day. History of tobacco in the past, started at age 53.  -Concerns and/or follow up today:   Hyperlipidemia: Currently he is on atorvastatin 40 mg daily.  Lab Results  Component Value Date   CHOL 115 02/25/2019   HDL 32.60 (L) 02/25/2019   LDLCALC 55 02/25/2019   TRIG 137.0 02/25/2019   CHOLHDL 4 02/25/2019   Hypertension: He is taking lisinopril 20 mg daily. He has had irregular heart rate since 08/2018. He has not noted any dizziness, palpitation, diaphoresis, or CP.   Lab Results  Component Value Date   CREATININE 1.13 02/25/2019   BUN 21 02/25/2019   NA 138 02/25/2019   K 4.1 02/25/2019   CL 102 02/25/2019   CO2 27 02/25/2019   Lab Results  Component Value Date   HGBA1C 6.1 02/25/2019    Review of Systems  Constitutional: Negative for activity change, appetite change, fatigue and fever.  HENT: Negative  for dental problem, nosebleeds, sore throat and trouble swallowing. +Hearing loss,bilateral. Eyes: Negative for redness and visual disturbance.  Respiratory: Negative for cough, shortness of breath and wheezing.   Cardiovascular: Negative for dyspnea and leg swelling.  Gastrointestinal: Negative for abdominal pain, blood in stool, nausea and vomiting.  Endocrine: Negative for cold intolerance, heat intolerance, polydipsia, polyphagia and polyuria.  Genitourinary: Negative for decreased urine volume, discharge, dysuria, genital sores, hematuria and testicular pain.  Musculoskeletal: Negative for gait problem and myalgias.  Skin: Negative for color change and rash.  Allergic/Immunologic: Positive for environmental allergies.  Neurological: Negative for syncope, weakness and headaches.  Hematological: Negative for adenopathy. Does not bruise/bleed easily.  Psychiatric/Behavioral: Negative for confusion. The patient is not nervous/anxious.   All other systems reviewed and are negative.  Current Outpatient Medications on File Prior to Visit  Medication Sig Dispense Refill  . atorvastatin (LIPITOR) 40 MG tablet Take 1 tablet (40 mg total) by mouth daily. 90 tablet 3  . hydrocortisone (ANUSOL-HC) 2.5 % rectal cream Place 1 application rectally daily as needed for hemorrhoids or anal itching. 30 g 1  . lisinopril (ZESTRIL) 20 MG tablet Take 1 tablet (20 mg total) by mouth daily. 90 tablet 2  . omeprazole (PRILOSEC) 20 MG capsule TAKE 1 CAPSULE BY MOUTH EVERY DAY 90 capsule 1   No current facility-administered medications on file prior to visit.   Past Medical History:  Diagnosis Date  . Allergy   . GERD (gastroesophageal reflux disease)   . Hyperlipemia   .  Hypertension   . Pneumonia    Past Surgical History:  Procedure Laterality Date  . BACK SURGERY     Allergies  Allergen Reactions  . Aspirin Shortness Of Breath    Other reaction(s): Wheezing (ALLERGY/intolerance)  . Nsaids  Anaphylaxis    Family History  Problem Relation Age of Onset  . Arthritis Mother   . Hyperlipidemia Father   . Hypertension Father   . Diabetes Father   . Diabetes Maternal Grandfather    Social History   Socioeconomic History  . Marital status: Married    Spouse name: Not on file  . Number of children: Not on file  . Years of education: Not on file  . Highest education level: Not on file  Occupational History  . Not on file  Tobacco Use  . Smoking status: Current Every Day Smoker    Packs/day: 0.50    Types: Cigarettes  . Smokeless tobacco: Current User  Substance and Sexual Activity  . Alcohol use: Yes    Comment: occasional  . Drug use: No  . Sexual activity: Yes  Other Topics Concern  . Not on file  Social History Narrative  . Not on file   Social Determinants of Health   Financial Resource Strain:   . Difficulty of Paying Living Expenses: Not on file  Food Insecurity:   . Worried About Programme researcher, broadcasting/film/video in the Last Year: Not on file  . Ran Out of Food in the Last Year: Not on file  Transportation Needs:   . Lack of Transportation (Medical): Not on file  . Lack of Transportation (Non-Medical): Not on file  Physical Activity:   . Days of Exercise per Week: Not on file  . Minutes of Exercise per Session: Not on file  Stress:   . Feeling of Stress : Not on file  Social Connections:   . Frequency of Communication with Friends and Family: Not on file  . Frequency of Social Gatherings with Friends and Family: Not on file  . Attends Religious Services: Not on file  . Active Member of Clubs or Organizations: Not on file  . Attends Banker Meetings: Not on file  . Marital Status: Not on file    Today's Vitals   08/29/19 0751  BP: 126/80  Pulse: 74  Resp: 16  Temp: (!) 96.3 F (35.7 C)  TempSrc: Temporal  SpO2: 94%  Weight: 184 lb (83.5 kg)  Height: 5\' 10"  (1.778 m)   Body mass index is 26.4 kg/m.  Wt Readings from Last 3 Encounters:   08/29/19 184 lb (83.5 kg)  02/25/19 177 lb 4 oz (80.4 kg)  08/27/18 185 lb 4 oz (84 kg)   Physical Exam  Nursing note and vitals reviewed. Constitutional: He is oriented to person, place, and time. He appears well-developed and well-nourished. No distress.  HENT:  Head: Normocephalic and atraumatic.  Right Ear: External ear. Ear aid in place. Left Ear: External ear and ear aid in place. Mouth/Throat: Oropharynx is clear and moist and mucous membranes are normal.  Eyes: Pupils are equal, round, and reactive to light. Conjunctivae and EOM are normal.  Neck: Normal range of motion. No tracheal deviation present. No thyromegaly present.  Cardiovascular: Normal rate and irregular rhythm,occasional extrasystoles..  No murmur heard. Pulses:      Dorsalis pedis pulses are 2+ on the right side and 2+ on the left side.  Respiratory: Effort normal and breath sounds normal. No respiratory distress.  GI: Soft.  He exhibits no mass. There is no hepatomegaly. There is no abdominal tenderness.  Genitourinary: No concerns,deferred to urologist.  Musculoskeletal:        General: No tenderness or edema.     Comments: No signs of synovitis.  Lymphadenopathy:    He has no cervical adenopathy.       Right: No supraclavicular adenopathy present.       Left: No supraclavicular adenopathy present.  Neurological: He is alert and oriented to person, place, and time. He has normal strength. No cranial nerve deficit or sensory deficit. Coordination and gait normal.  Reflex Scores:      Bicep reflexes are 2+ on the right side and 2+ on the left side.      Patellar reflexes are 2+ on the right side and 2+ on the left side. Skin: Skin is warm. No erythema. Hypertrophic toenails, right foot. Psychiatric: He has a normal mood and affect. Cognition and memory are normal.  Well groomed,good eye contact.   ASSESSMENT AND PLAN:  [name] was here today annual physical examination.  Diagnoses and  all orders for this visit: Orders Placed This Encounter  Procedures  . Flu Vaccine QUAD 6+ mos PF IM (Fluarix Quad PF)  . PSA(Must document that pt has been informed of limitations of PSA testing.)  . Lipid panel  . Comprehensive metabolic panel  . Hemoglobin A1c  . EKG 12-Lead   Lab Results  Component Value Date   CHOL 129 08/29/2019   HDL 37.40 (L) 08/29/2019   LDLCALC 59 08/29/2019   TRIG 162.0 (H) 08/29/2019   CHOLHDL 3 08/29/2019   Lab Results  Component Value Date   PSA 2.28 08/29/2019   Lab Results  Component Value Date   CREATININE 1.00 08/29/2019   BUN 17 08/29/2019   NA 137 08/29/2019   K 4.7 08/29/2019   CL 102 08/29/2019   CO2 28 08/29/2019   Lab Results  Component Value Date   ALT 18 08/29/2019   AST 14 08/29/2019   ALKPHOS 84 08/29/2019   BILITOT 0.5 08/29/2019   Lab Results  Component Value Date   HGBA1C 6.3 08/29/2019    Routine general medical examination at a health care facility We discussed the importance of regular physical activity and healthy diet for prevention of chronic illness and/or complications. Preventive guidelines reviewed. Vaccination up-to-date. Next CPE in a year.  Irregular heart rhythm Mild and asymptomatic. EKG today: SR,normal axis and intervals. No PAC's or PVC's. Clearly instructed about warning signs.  Prostate cancer screening -     PSA(Must document that pt has been informed of limitations of PSA testing.)  Screening for endocrine, metabolic and immunity disorder -     Comprehensive metabolic panel -     Hemoglobin A1c  Need for influenza vaccination -     Flu Vaccine QUAD 6+ mos PF IM (Fluarix Quad PF)  Essential hypertension BP adequately controlled. Continue lisinopril 20 mg. Continue low-salt diet.  Mixed hyperlipidemia No changes in current management, will follow labs done today and will give further recommendations accordingly.    Return in 6 months (on 02/26/2020) for HTN.    Teigan Sahli G.  [place], MD  [facility]. [place] office.

## 2019-08-29 NOTE — Patient Instructions (Addendum)
A few things to remember from today's visit:   Routine general medical examination at a health care facility  Essential hypertension - Plan: EKG 12-Lead  Irregular heart rhythm - Plan: EKG 12-Lead  Mixed hyperlipidemia - Plan: Lipid panel  Prostate cancer screening - Plan: PSA(Must document that pt has been informed of limitations of PSA testing.)  Screening for endocrine, metabolic and immunity disorder - Plan: Comprehensive metabolic panel, Hemoglobin A1c   At least 150 minutes of moderate exercise per week, daily brisk walking for 15-30 min is a good exercise option. Healthy diet low in saturated (animal) fats and sweets and consisting of fresh fruits and vegetables, lean meats such as fish and white chicken and whole grains.  - Vaccines:  Tdap vaccine every 10 years.  Shingles vaccine recommended at age 76, could be given after 62 years of age but not sure about insurance coverage.  Pneumonia vaccines:  Prevnar 13 at 65 and Pneumovax at 69.   -Screening recommendations for low/normal risk males:  Screening for diabetes at age 19 and every 3 years. Earlier screening if cardiovascular risk factors.   Lipid screening:N/A  Colon cancer screening at age 85 and until age 50.  Prostate cancer screening: some controversy, starts usually at 50: Rectal exam and PSA.  Aortic Abdominal Aneurism once between 38 and 51 years old if ever smoker.  Also recommended:  1. Dental visit- Brush and floss your teeth twice daily; visit your dentist twice a year. 2. Eye doctor- Get an eye exam at least every 2 years. 3. Helmet use- Always wear a helmet when riding a bicycle, motorcycle, rollerblading or skateboarding. 4. Safe sex- If you may be exposed to sexually transmitted infections, use a condom. 5. Seat belts- Seat belts can save your live; always wear one. 6. Smoke/Carbon Monoxide detectors- These detectors need to be installed on the appropriate level of your home. Replace  batteries at least once a year. 7. Skin cancer- When out in the sun please cover up and use sunscreen 15 SPF or higher. 8. Violence- If anyone is threatening or hurting you, please tell your healthcare provider.  9. Drink alcohol in moderation- Limit alcohol intake to one drink or less per day. Never drink and drive.  Please be sure medication list is accurate. If a new problem present, please set up appointment sooner than planned today.

## 2019-08-29 NOTE — Assessment & Plan Note (Signed)
No changes in current management, will follow labs done today and will give further recommendations accordingly.  

## 2019-10-23 ENCOUNTER — Other Ambulatory Visit: Payer: Self-pay | Admitting: Family Medicine

## 2019-10-23 DIAGNOSIS — K219 Gastro-esophageal reflux disease without esophagitis: Secondary | ICD-10-CM

## 2019-12-06 ENCOUNTER — Encounter: Payer: Self-pay | Admitting: Family Medicine

## 2019-12-06 ENCOUNTER — Telehealth (INDEPENDENT_AMBULATORY_CARE_PROVIDER_SITE_OTHER): Payer: BC Managed Care – PPO | Admitting: Family Medicine

## 2019-12-06 VITALS — Temp 98.0°F | Ht 67.1 in

## 2019-12-06 DIAGNOSIS — F17200 Nicotine dependence, unspecified, uncomplicated: Secondary | ICD-10-CM

## 2019-12-06 DIAGNOSIS — J301 Allergic rhinitis due to pollen: Secondary | ICD-10-CM

## 2019-12-06 DIAGNOSIS — J069 Acute upper respiratory infection, unspecified: Secondary | ICD-10-CM

## 2019-12-06 MED ORDER — FLUTICASONE PROPIONATE 50 MCG/ACT NA SUSP: 2.0000 | Freq: Every day | NASAL | 6 refills | Status: DC

## 2019-12-06 MED ORDER — PREDNISONE 20 MG PO TABS: 40.0000 mg | ORAL_TABLET | Freq: Every day | ORAL | 0 refills | Status: AC

## 2019-12-06 NOTE — Progress Notes (Signed)
Virtual Visit via Video Note   I connected with Steven Cook on 12/06/19 by a video enabled telemedicine application and verified that I am speaking with the correct person using two identifiers.  Location patient: home Location provider:work office Persons participating in the virtual visit: patient, provider  I discussed the limitations of evaluation and management by telemedicine and the availability of in person appointments. The patient expressed understanding and agreed to proceed.  HPI: Steven Cook is a 64 yo male with hx of tobacco use ,HTN,and allergies c/o respiratory symptoms that started early in the morning (around midnight) while he was at work, 3rd shift. + Fatigue and frontal pressure headache,the latter one is "not bad." + Blood in mucus x 1. + "Watery eyes." + Productive cough with some greenish sputum this morning, denies hemoptysis. Cough is exacerbated by blowing his nose.  No associated visual changes, focal weakness, or body aches. Negative for fever, chills, sore throat, CP, wheezing,SOB, abdominal pain, N/V, changes in bowel habits, or a skin rash. + Smoker. He already received his first COVID-19 vaccine 2 weeks ago. Negative for anosmia and ageusia.  His wife was sick with "allergies" a couple weeks ago. Negative for infectious sick contact, including COVID-19. Negative for recent travel.  He has taken Tylenol sinuses.  ROS: See pertinent positives and negatives per HPI.  Past Medical History:  Diagnosis Date  . Allergy   . GERD (gastroesophageal reflux disease)   . Hyperlipemia   . Hypertension   . Pneumonia     Past Surgical History:  Procedure Laterality Date  . BACK SURGERY      Family History  Problem Relation Age of Onset  . Arthritis Mother   . Hyperlipidemia Father   . Hypertension Father   . Diabetes Father   . Diabetes Maternal Grandfather     Social History   Socioeconomic History  . Marital status: Married    Spouse name:  Not on file  . Number of children: Not on file  . Years of education: Not on file  . Highest education level: Not on file  Occupational History  . Not on file  Tobacco Use  . Smoking status: Current Every Day Smoker    Packs/day: 0.50    Types: Cigarettes  . Smokeless tobacco: Current User  Substance and Sexual Activity  . Alcohol use: Yes    Comment: occasional  . Drug use: No  . Sexual activity: Yes  Other Topics Concern  . Not on file  Social History Narrative  . Not on file   Social Determinants of Health   Financial Resource Strain:   . Difficulty of Paying Living Expenses:   Food Insecurity:   . Worried About Charity fundraiser in the Last Year:   . Arboriculturist in the Last Year:   Transportation Needs:   . Film/video editor (Medical):   Marland Kitchen Lack of Transportation (Non-Medical):   Physical Activity:   . Days of Exercise per Week:   . Minutes of Exercise per Session:   Stress:   . Feeling of Stress :   Social Connections:   . Frequency of Communication with Friends and Family:   . Frequency of Social Gatherings with Friends and Family:   . Attends Religious Services:   . Active Member of Clubs or Organizations:   . Attends Archivist Meetings:   Marland Kitchen Marital Status:   Intimate Partner Violence:   . Fear of Current or Ex-Partner:   .  Emotionally Abused:   Marland Kitchen Physically Abused:   . Sexually Abused:     Current Outpatient Medications:  .  atorvastatin (LIPITOR) 40 MG tablet, Take 1 tablet (40 mg total) by mouth daily., Disp: 90 tablet, Rfl: 3 .  hydrocortisone (ANUSOL-HC) 2.5 % rectal cream, Place 1 application rectally daily as needed for hemorrhoids or anal itching., Disp: 30 g, Rfl: 1 .  lisinopril (ZESTRIL) 20 MG tablet, Take 1 tablet (20 mg total) by mouth daily., Disp: 90 tablet, Rfl: 2 .  omeprazole (PRILOSEC) 20 MG capsule, TAKE 1 CAPSULE BY MOUTH EVERY DAY, Disp: 90 capsule, Rfl: 3 .  fluticasone (FLONASE) 50 MCG/ACT nasal spray, Place 2  sprays into both nostrils daily., Disp: 16 g, Rfl: 6 .  predniSONE (DELTASONE) 20 MG tablet, Take 2 tablets (40 mg total) by mouth daily with breakfast for 3 days., Disp: 6 tablet, Rfl: 0  EXAM:  VITALS per patient if applicable:Temp 98 F (36.7 C)   GENERAL: alert, oriented, appears well and in no acute distress  HEENT: atraumatic, conjunctiva clear, no obvious abnormalities on inspection of external nose and ears. Mild nasal congestion.  NECK: normal movements of the head and neck  LUNGS: on inspection no signs of respiratory distress, breathing rate appears normal, no obvious gross SOB, gasping or wheezing  CV: no obvious cyanosis  MS: moves all visible extremities without noticeable abnormality  PSYCH/NEURO: pleasant and cooperative, no obvious depression or anxiety, speech and thought processing grossly intact  ASSESSMENT AND PLAN:  Discussed the following assessment and plan:  URI, acute History does not suggest a serious infectious process. For now recommend symptomatic treatment. Monitor for new symptoms. If fever develops and respiratory symptoms get worse, strongly recommend being tested for COVID-19 infection. He is going back to work Thursday night, he will let me know if he has any fever or worsening symptoms. Instructed about warning signs.  Instructed to let me know if still having congestion in 10 days,before if symptoms get worse.  Tobacco use disorder Explained that cough can last a few more weeks after recovery.The fact that he smokes may aggravate cough and last longer. Plain Mucinex may help. Encouraged smoking cessation.  Seasonal allergic rhinitis due to pollen - Plan: predniSONE (DELTASONE) 20 MG tablet, fluticasone (FLONASE) 50 MCG/ACT nasal spray This probably could be contributing to above symptoms. Recommend nasal irrigation with saline as needed. Flonase nasal spray daily as needed. Short course of prednisone may help, discussed side effects,  recommend taking it with breakfast.  I discussed the assessment and treatment plan with the patient. Steven Keimig was provided an opportunity to ask questions and all were answered. He agreed with the plan and demonstrated an understanding of the instructions.   The patient was advised to call back or seek an in-person evaluation if the symptoms worsen or if the condition fails to improve as anticipated.  Return if symptoms worsen or fail to improve.    Brookelle Pellicane [place], MD

## 2019-12-09 DIAGNOSIS — Z03818 Encounter for observation for suspected exposure to other biological agents ruled out: Secondary | ICD-10-CM | POA: Diagnosis not present

## 2019-12-09 DIAGNOSIS — Z20828 Contact with and (suspected) exposure to other viral communicable diseases: Secondary | ICD-10-CM | POA: Diagnosis not present

## 2019-12-09 DIAGNOSIS — I10 Essential (primary) hypertension: Secondary | ICD-10-CM | POA: Diagnosis not present

## 2020-02-24 ENCOUNTER — Other Ambulatory Visit: Payer: Self-pay

## 2020-02-24 ENCOUNTER — Ambulatory Visit: Payer: BC Managed Care – PPO | Admitting: Family Medicine

## 2020-02-24 ENCOUNTER — Encounter: Payer: Self-pay | Admitting: Family Medicine

## 2020-02-24 VITALS — BP 120/85 | HR 77 | Temp 98.1°F | Resp 12 | Ht 67.1 in | Wt 180.5 lb

## 2020-02-24 DIAGNOSIS — I10 Essential (primary) hypertension: Secondary | ICD-10-CM | POA: Diagnosis not present

## 2020-02-24 DIAGNOSIS — E663 Overweight: Secondary | ICD-10-CM

## 2020-02-24 DIAGNOSIS — R7303 Prediabetes: Secondary | ICD-10-CM

## 2020-02-24 MED ORDER — LISINOPRIL 20 MG PO TABS: 20.0000 mg | ORAL_TABLET | Freq: Every day | ORAL | 2 refills | Status: DC

## 2020-02-24 NOTE — Assessment & Plan Note (Signed)
BP re-checked 120/85. No changes in current management. Continue low salt diet. Recommend monitoring BP at home.

## 2020-02-24 NOTE — Patient Instructions (Addendum)
A few things to remember from today's visit:   Essential hypertension - Plan: Basic metabolic panel  Prediabetes - Plan: Hemoglobin A1c  If you need refills please call your pharmacy. Do not use My Chart to request refills or for acute issues that need immediate attention.   No changes today. Monitor blood pressure at home.  Please be sure medication list is accurate. If a new problem present, please set up appointment sooner than planned today.

## 2020-02-24 NOTE — Progress Notes (Signed)
HPI: Mr.Rylie Tollie Canada is a 64 y.o. male, who is here today for 6 months follow up.   He was last seen on 12/06/19 for acute visit. HTN on Lisinopril 20 mg daily. Negative for severe/frequent headache, visual changes, chest pain, dyspnea, palpitation, claudication, focal weakness, or edema.  Lab Results  Component Value Date   CREATININE 1.00 08/29/2019   BUN 17 08/29/2019   NA 137 08/29/2019   K 4.7 08/29/2019   CL 102 08/29/2019   CO2 28 08/29/2019   + Smoker, 2 packs of cig last 1 week.  Prediabetes: Denies abdominal pain, nausea,vomiting, polydipsia,polyuria, or polyphagia. He is exercising regularly, bowling. Follows a healthful diet,he has noted some wt loss.  Last HgA1C was 6.3 in 08/2019.  Review of Systems  Constitutional: Negative for activity change, appetite change, fatigue and fever.  HENT: Negative for mouth sores, nosebleeds and sore throat.   Respiratory: Negative for cough and wheezing.   Gastrointestinal: Negative for abdominal pain, nausea and vomiting.  Genitourinary: Negative for decreased urine volume and hematuria.  Neurological: Negative for syncope, facial asymmetry and weakness.  Rest of ROS, see pertinent positives sand negatives in HPI  Current Outpatient Medications on File Prior to Visit  Medication Sig Dispense Refill  . atorvastatin (LIPITOR) 40 MG tablet Take 1 tablet (40 mg total) by mouth daily. 90 tablet 3  . fluticasone (FLONASE) 50 MCG/ACT nasal spray Place 2 sprays into both nostrils daily. 16 g 6  . hydrocortisone (ANUSOL-HC) 2.5 % rectal cream Place 1 application rectally daily as needed for hemorrhoids or anal itching. 30 g 1  . omeprazole (PRILOSEC) 20 MG capsule TAKE 1 CAPSULE BY MOUTH EVERY DAY 90 capsule 3   No current facility-administered medications on file prior to visit.     Past Medical History:  Diagnosis Date  . Allergy   . GERD (gastroesophageal reflux disease)   . Hyperlipemia   . Hypertension   .  Pneumonia    Allergies  Allergen Reactions  . Aspirin Shortness Of Breath    Other reaction(s): Wheezing (ALLERGY/intolerance)  . Nsaids Anaphylaxis    Social History   Socioeconomic History  . Marital status: Married    Spouse name: Not on file  . Number of children: Not on file  . Years of education: Not on file  . Highest education level: Not on file  Occupational History  . Not on file  Tobacco Use  . Smoking status: Current Every Day Smoker    Packs/day: 0.50    Types: Cigarettes  . Smokeless tobacco: Current User  Substance and Sexual Activity  . Alcohol use: Yes    Comment: occasional  . Drug use: No  . Sexual activity: Yes  Other Topics Concern  . Not on file  Social History Narrative  . Not on file   Social Determinants of Health   Financial Resource Strain:   . Difficulty of Paying Living Expenses:   Food Insecurity:   . Worried About Charity fundraiser in the Last Year:   . Arboriculturist in the Last Year:   Transportation Needs:   . Film/video editor (Medical):   Marland Kitchen Lack of Transportation (Non-Medical):   Physical Activity:   . Days of Exercise per Week:   . Minutes of Exercise per Session:   Stress:   . Feeling of Stress :   Social Connections:   . Frequency of Communication with Friends and Family:   . Frequency of  Social Gatherings with Friends and Family:   . Attends Religious Services:   . Active Member of Clubs or Organizations:   . Attends Banker Meetings:   Marland Kitchen Marital Status:     Vitals:   02/24/20 0757 02/24/20 0815  BP: 138/90 120/85  Pulse: 77   Resp: 12   Temp: 98.1 F (36.7 C)   SpO2: 95%    Wt Readings from Last 3 Encounters:  02/24/20 180 lb 8 oz (81.9 kg)  08/29/19 184 lb (83.5 kg)  02/25/19 177 lb 4 oz (80.4 kg)   Body mass index is 28.19 kg/m.  Physical Exam Nursing note reviewed.  Constitutional:      General: He is not in acute distress.    Appearance: He is well-developed.  HENT:      Head: Normocephalic and atraumatic.     Mouth/Throat:     Mouth: Mucous membranes are moist.     Pharynx: Oropharynx is clear.  Eyes:     Conjunctiva/sclera: Conjunctivae normal.     Pupils: Pupils are equal, round, and reactive to light.  Cardiovascular:     Rate and Rhythm: Normal rate and regular rhythm.     Pulses:          Dorsalis pedis pulses are 2+ on the right side and 2+ on the left side.     Heart sounds: No murmur heard.   Pulmonary:     Effort: Pulmonary effort is normal. No respiratory distress.     Breath sounds: Normal breath sounds.  Abdominal:     Palpations: Abdomen is soft. There is no hepatomegaly or mass.     Tenderness: There is no abdominal tenderness.  Lymphadenopathy:     Cervical: No cervical adenopathy.  Skin:    General: Skin is warm.     Findings: No erythema or rash.  Neurological:     Mental Status: He is alert and oriented to person, place, and time.     Cranial Nerves: No cranial nerve deficit.     Gait: Gait normal.  Psychiatric:     Comments: Well groomed, good eye contact.     ASSESSMENT AND PLAN:  Mr. Jasten Guyette was seen today for 6 months follow-up.  Orders Placed This Encounter  Procedures  . Hemoglobin A1c  . BMP with eGFR([facility])   Lab Results  Component Value Date   HGBA1C 5.7 (H) 02/24/2020   Lab Results  Component Value Date   CREATININE 1.12 02/24/2020   BUN 20 02/24/2020   NA 140 02/24/2020   K 4.7 02/24/2020   CL 108 02/24/2020   CO2 24 02/24/2020    Essential hypertension BP re-checked 120/85. No changes in current management. Continue low salt diet. Recommend monitoring BP at home.  Prediabetes Healthy life style for primary prevention recommended. Further recommendations according to HgA1C.  Overweight (BMI 25.0-29.9) Lost about 4 Lb since 08/2019. Encouraged to continue regular physical activity and a healthful diet.  Return in about 7 months (around 09/11/2020) for CPE.   Ashling Roane G. [place],  MD  [facility]. [place] [facility].  A few things to remember from today's visit:   Essential hypertension - Plan: Basic metabolic panel  Prediabetes - Plan: Hemoglobin A1c  If you need refills please call your pharmacy. Do not use My Chart to request refills or for acute issues that need immediate attention.   No changes today. Monitor blood pressure at home.  Please be sure medication list is accurate. If a  new problem present, please set up appointment sooner than planned today.

## 2020-02-24 NOTE — Assessment & Plan Note (Signed)
Healthy life style for primary prevention recommended. Further recommendations according to HgA1C. 

## 2020-02-25 LAB — BASIC METABOLIC PANEL WITH GFR
BUN: 20 mg/dL (ref 7–25)
CO2: 24 mmol/L (ref 20–32)
Calcium: 9.1 mg/dL (ref 8.6–10.3)
Chloride: 108 mmol/L (ref 98–110)
Creat: 1.12 mg/dL (ref 0.70–1.25)
GFR, Est African American: 81 mL/min/{1.73_m2} (ref >=60)
GFR, Est Non African American: 70 mL/min/{1.73_m2} (ref >=60)
Glucose, Bld: 107 mg/dL — ABNORMAL HIGH (ref 65–99)
Potassium: 4.7 mmol/L (ref 3.5–5.3)
Sodium: 140 mmol/L (ref 135–146)

## 2020-02-25 LAB — HEMOGLOBIN A1C
Hgb A1c MFr Bld: 5.7 % of total Hgb — ABNORMAL HIGH (ref <5.7)
Mean Plasma Glucose: 117 (calc)
eAG (mmol/L): 6.5 (calc)

## 2020-04-27 ENCOUNTER — Other Ambulatory Visit: Payer: Self-pay | Admitting: Registered Nurse

## 2020-08-09 ENCOUNTER — Other Ambulatory Visit: Payer: Self-pay | Admitting: Internal Medicine

## 2020-08-13 ENCOUNTER — Other Ambulatory Visit: Payer: Self-pay

## 2020-08-13 ENCOUNTER — Telehealth: Payer: BC Managed Care – PPO | Admitting: Family Medicine

## 2020-08-13 ENCOUNTER — Encounter: Payer: Self-pay | Admitting: Family Medicine

## 2020-08-13 DIAGNOSIS — J069 Acute upper respiratory infection, unspecified: Secondary | ICD-10-CM | POA: Diagnosis not present

## 2020-08-13 NOTE — Progress Notes (Signed)
Patient ID: Steven Cook, male   DOB: 1956/07/18, 65 y.o.   MRN: [id number]  This visit type was conducted due to national recommendations for restrictions regarding the COVID-19 pandemic in an effort to limit this patient's exposure and mitigate transmission in our community.   Virtual Visit via Video Note  I connected with Jefm Miles on 08/13/20 at  1:45 PM EST by a video enabled telemedicine application and verified that I am speaking with the correct person using two identifiers.  Location patient: home Location provider:work or home office Persons participating in the virtual visit: patient, provider  I discussed the limitations of evaluation and management by telemedicine and the availability of in person appointments. The patient expressed understanding and agreed to proceed.   HPI: Mr. Raschke relates onset this past Friday night of fever, nasal congestion, body aches and mild cough.  He took some Tylenol.  After running fever Friday night he has not noted any fever since then.  He does feel some improved overall though he still has fairly severe fatigue.  He has a son that lives with him who tested positive last Tuesday for Covid.  His son has stayed relatively isolated from patient and his wife.  Patient's wife also has similar symptoms.  They have had their first 2 Covid vaccines but have not had booster.  No nausea or vomiting.  Keeping down fluids well.  Chronic problems include history of hypertension and hyperlipidemia.  Does smoke.   ROS: See pertinent positives and negatives per HPI.  Past Medical History:  Diagnosis Date  . Allergy   . GERD (gastroesophageal reflux disease)   . Hyperlipemia   . Hypertension   . Pneumonia     Past Surgical History:  Procedure Laterality Date  . BACK SURGERY      Family History  Problem Relation Age of Onset  . Arthritis Mother   . Hyperlipidemia Father   . Hypertension Father   . Diabetes Father   . Diabetes Maternal  Grandfather     SOCIAL HX: History of nicotine use   Current Outpatient Medications:  .  atorvastatin (LIPITOR) 40 MG tablet, Take 1 tablet (40 mg total) by mouth daily., Disp: 90 tablet, Rfl: 3 .  fluticasone (FLONASE) 50 MCG/ACT nasal spray, Place 2 sprays into both nostrils daily., Disp: 16 g, Rfl: 6 .  hydrocortisone (ANUSOL-HC) 2.5 % rectal cream, Place 1 application rectally daily as needed for hemorrhoids or anal itching., Disp: 30 g, Rfl: 1 .  lisinopril (ZESTRIL) 20 MG tablet, Take 1 tablet (20 mg total) by mouth daily., Disp: 90 tablet, Rfl: 2 .  omeprazole (PRILOSEC) 20 MG capsule, TAKE 1 CAPSULE BY MOUTH EVERY DAY, Disp: 90 capsule, Rfl: 3  EXAM:  VITALS per patient if applicable:  GENERAL: alert, oriented, appears well and in no acute distress  HEENT: atraumatic, conjunttiva clear, no obvious abnormalities on inspection of external nose and ears  NECK: normal movements of the head and neck  LUNGS: on inspection no signs of respiratory distress, breathing rate appears normal, no obvious gross SOB, gasping or wheezing  CV: no obvious cyanosis  MS: moves all visible extremities without noticeable abnormality  PSYCH/NEURO: pleasant and cooperative, no obvious depression or anxiety, speech and thought processing grossly intact  ASSESSMENT AND PLAN:  Discussed the following assessment and plan:  Cough and upper respiratory symptoms.  Fairly strong clinical suspicion for Covid.  Patient is in no respiratory distress and feels improved compared to onset  -He plans  to go and get Covid tested later today.  We explained limited availability of monoclonal antibodies at this point but he will let us know if positive -Plenty of fluids and rest and we have recommended minimum of 5-day isolation from onset     I discussed the assessment and treatment plan with the patient. The patient was provided an opportunity to ask questions and all were answered. The patient agreed with  the plan and demonstrated an understanding of the instructions.   The patient was advised to call back or seek an in-person evaluation if the symptoms worsen or if the condition fails to improve as anticipated.     Evelena Peat, MD

## 2020-08-14 DIAGNOSIS — U071 COVID-19: Secondary | ICD-10-CM | POA: Diagnosis not present

## 2020-08-29 ENCOUNTER — Encounter: Payer: BC Managed Care – PPO | Admitting: Family Medicine

## 2020-09-09 ENCOUNTER — Other Ambulatory Visit: Payer: Self-pay | Admitting: Family Medicine

## 2020-09-10 ENCOUNTER — Other Ambulatory Visit: Payer: Self-pay

## 2020-09-10 NOTE — Progress Notes (Signed)
HPI:  [name] is a 65 y.o.male here today for his routine physical examination.  Last CPE: 08/29/19. He lives with his wife.  Regular exercise 3 or more times per week: He is not exercising regularly but he walks 3-4 miles when he is at work. Following a healthy diet: Yes, in general. Loves salads, grilled food. occasional bacon and sausages.  Chronic medical problems: HTN,HLD,GERD, irregular HR,and BPH among some.  Immunization History  Administered Date(s) Administered  . Influenza, Seasonal, Injecte, Preservative Fre 06/12/2015  . Influenza,inj,Quad PF,6+ Mos 08/26/2016, 08/26/2017, 08/27/2018, 08/29/2019, 09/11/2020  . Pneumococcal Polysaccharide-23 08/26/2017  . Tdap 09/04/2011, 09/04/2011  . Zoster 08/27/2018   -Hep C screening: 08/26/16 NR, Last colon cancer screening: 08/2016. Last prostate ca screening:  BPH, nocturia x 0.  Lab Results  Component Value Date   PSA 2.28 08/29/2019   PSA 2.61 08/27/2018   PSA 1.93 02/23/2018   Negative for high alcohol intake. + Tobacco use since age  Intermittent 18 to date, < 1/2 PPD.1 Pack of cig lasts about 3 days.  -Concerns and/or follow up today:   HTN: He is on Lisinopril 20 mg daily.  Lab Results  Component Value Date   CREATININE 1.12 02/24/2020   BUN 20 02/24/2020   NA 140 02/24/2020   K 4.7 02/24/2020   CL 108 02/24/2020   CO2 24 02/24/2020    HLD: He is on Atorvastatin 40 mg daily.  Lab Results  Component Value Date   CHOL 129 08/29/2019   HDL 37.40 (L) 08/29/2019   LDLCALC 59 08/29/2019   TRIG 162.0 (H) 08/29/2019   CHOLHDL 3 08/29/2019   Lab Results  Component Value Date   HGBA1C 5.7 (H) 02/24/2020   He started taking Aspirin 81 mg daily.  3 weeks ago noted erythematous lesion on penis. No tender or pruritic. No hx of trauma, new body product,or insect bite. Neosporin OTC seem to be helping. Negative for urethral discharge or urinary symptoms. Mild burning after sex  intercourse.  Review of Systems  Constitutional: Negative for activity change, appetite change, fatigue and fever.  HENT: Negative for mouth sores, nosebleeds and sore throat.   Eyes: Negative for redness and visual disturbance.  Respiratory: Negative for cough, shortness of breath and wheezing.   Cardiovascular: Negative for chest pain, palpitations and leg swelling.  Gastrointestinal: Negative for abdominal pain, blood in stool, nausea and vomiting.  Endocrine: Negative for cold intolerance, heat intolerance, polydipsia, polyphagia and polyuria.  Genitourinary: Negative for decreased urine volume, dysuria, genital sores, hematuria and testicular pain.  Musculoskeletal: Negative for gait problem and myalgias.  Skin: Positive for rash. Negative for color change.  Allergic/Immunologic: Positive for environmental allergies.  Neurological: Negative for syncope, weakness and headaches.  Hematological: Negative for adenopathy. Does not bruise/bleed easily.  Psychiatric/Behavioral: Negative for behavioral problems and confusion.   Current Outpatient Medications on File Prior to Visit  Medication Sig Dispense Refill  . aspirin EC 81 MG tablet Take 81 mg by mouth daily. Swallow whole.    Marland Kitchen atorvastatin (LIPITOR) 40 MG tablet TAKE 1 TABLET BY MOUTH EVERY DAY 90 tablet 3  . fluticasone (FLONASE) 50 MCG/ACT nasal spray Place 2 sprays into both nostrils daily. 16 g 6  . hydrocortisone (ANUSOL-HC) 2.5 % rectal cream Place 1 application rectally daily as needed for hemorrhoids or anal itching. 30 g 1  . lisinopril (ZESTRIL) 20 MG tablet Take 1 tablet (20 mg total) by mouth daily. 90 tablet 2  . omeprazole (  PRILOSEC) 20 MG capsule TAKE 1 CAPSULE BY MOUTH EVERY DAY 90 capsule 3   No current facility-administered medications on file prior to visit.    Past Medical History:  Diagnosis Date  . Allergy   . GERD (gastroesophageal reflux disease)   . Hyperlipemia   . Hypertension   . Pneumonia      Past Surgical History:  Procedure Laterality Date  . BACK SURGERY      Allergies  Allergen Reactions  . Aspirin Shortness Of Breath    Other reaction(s): Wheezing (ALLERGY/intolerance)  . Nsaids Anaphylaxis    Family History  Problem Relation Age of Onset  . Arthritis Mother   . Hyperlipidemia Father   . Hypertension Father   . Diabetes Father   . Diabetes Maternal Grandfather     Social History   Socioeconomic History  . Marital status: Married    Spouse name: Not on file  . Number of children: Not on file  . Years of education: Not on file  . Highest education level: Not on file  Occupational History  . Not on file  Tobacco Use  . Smoking status: Current Every Day Smoker    Packs/day: 0.50    Types: Cigarettes  . Smokeless tobacco: Current User  Substance and Sexual Activity  . Alcohol use: Yes    Comment: occasional  . Drug use: No  . Sexual activity: Yes  Other Topics Concern  . Not on file  Social History Narrative  . Not on file   Social Determinants of Health   Financial Resource Strain: Not on file  Food Insecurity: Not on file  Transportation Needs: Not on file  Physical Activity: Not on file  Stress: Not on file  Social Connections: Not on file   Vitals:   09/11/20 0657  BP: 128/80  Pulse: 70  Resp: 12  SpO2: 98%   Body mass index is 28.17 kg/m.  Wt Readings from Last 3 Encounters:  09/11/20 180 lb 6 oz (81.8 kg)  02/24/20 180 lb 8 oz (81.9 kg)  08/29/19 184 lb (83.5 kg)   Physical Exam Vitals and nursing note reviewed.  Constitutional:      General: He is not in acute distress.    Appearance: He is well-developed.  HENT:     Head: Normocephalic and atraumatic.     Right Ear: Tympanic membrane, ear canal and external ear normal.     Left Ear: Tympanic membrane, ear canal and external ear normal.     Ears:     Comments: Hearing aids, bilateral.    Mouth/Throat:     Mouth: Oropharynx is clear and moist and mucous  membranes are normal. Mucous membranes are moist.     Pharynx: Oropharynx is clear.  Eyes:     Extraocular Movements: Extraocular movements intact and EOM normal.     Conjunctiva/sclera: Conjunctivae normal.     Pupils: Pupils are equal, round, and reactive to light.  Neck:     Thyroid: No thyromegaly.     Trachea: No tracheal deviation.  Cardiovascular:     Rate and Rhythm: Normal rate. Rhythm irregular. Occasional extrasystoles are present.    Pulses:          Dorsalis pedis pulses are 2+ on the right side and 2+ on the left side.     Heart sounds: No murmur heard.   Pulmonary:     Effort: Pulmonary effort is normal. No respiratory distress.     Breath sounds: Normal breath sounds.  Chest:  Breasts:     Right: No supraclavicular adenopathy.     Left: No supraclavicular adenopathy.    Abdominal:     Palpations: Abdomen is soft. There is no hepatomegaly or mass.     Tenderness: There is no abdominal tenderness.  Genitourinary:    Penis: Erythema present. No tenderness or discharge.      Testes:        Right: Mass or tenderness not present.        Left: Mass or tenderness not present.     Epididymis:     Right: Not enlarged. No mass or tenderness.     Left: Not enlarged. No mass.       Comments: Erythema affecting part of balanopreputial area. Erythematous U/C shape lesion, not raised, not tender, clear center. Musculoskeletal:        General: No tenderness or edema.     Cervical back: Normal range of motion.     Comments: No major deformities appreciated and no signs of synovitis.  Lymphadenopathy:     Cervical: No cervical adenopathy.     Upper Body:     Right upper body: No supraclavicular adenopathy.     Left upper body: No supraclavicular adenopathy.     Lower Body: No right inguinal adenopathy. No left inguinal adenopathy.  Skin:    General: Skin is warm.     Findings: Erythema and rash present.     Comments: See GU.  Neurological:     Mental Status: He is  alert and oriented to person, place, and time.     Cranial Nerves: No cranial nerve deficit.     Sensory: No sensory deficit.     Coordination: Coordination normal.     Gait: Gait normal.     Deep Tendon Reflexes: Strength normal.     Reflex Scores:      Bicep reflexes are 2+ on the right side and 2+ on the left side.      Patellar reflexes are 2+ on the right side and 2+ on the left side. Psychiatric:        Mood and Affect: Mood and affect normal.        Cognition and Memory: Cognition and memory normal.   ASSESSMENT AND PLAN:  [name] was seen today for annual exam.  Diagnoses and all orders for this visit: Orders Placed This Encounter  Procedures  . Flu Vaccine QUAD 36+ mos IM  . Comprehensive metabolic panel  . Hemoglobin A1c  . Lipid panel  . PSA(Must document that pt has been informed of limitations of PSA testing.)   Lab Results  Component Value Date   CHOL 123 09/11/2020   HDL 37.10 (L) 09/11/2020   LDLCALC 62 09/11/2020   TRIG 121.0 09/11/2020   CHOLHDL 3 09/11/2020   Lab Results  Component Value Date   CREATININE 1.09 09/11/2020   BUN 19 09/11/2020   NA 137 09/11/2020   K 4.2 09/11/2020   CL 102 09/11/2020   CO2 29 09/11/2020   Lab Results  Component Value Date   ALT 15 09/11/2020   AST 13 09/11/2020   ALKPHOS 75 09/11/2020   BILITOT 0.6 09/11/2020   Lab Results  Component Value Date   HGBA1C 6.2 09/11/2020   Lab Results  Component Value Date   PSA 3.28 09/11/2020   Routine general medical examination at a health care facility He understands the importance of regular physical activity and healthy diet for prevention of chronic illness and/or  complications. Preventive guidelines reviewed. Vaccination up to date.  Next CPE in a year. The ASCVD Risk score Denman George DC Jr., et al., 2013) failed to calculate for the following reasons:   The valid total cholesterol range is 130 to 320 mg/dL  Benign prostatic hyperplasia without lower urinary tract  symptoms Stable. Further recommendations according to PSA result.  Screening for endocrine, metabolic and immunity disorder -     Hemoglobin A1c -     Comprehensive metabolic panel  Need for influenza vaccination -     Flu Vaccine QUAD 36+ mos IM  Rash of penis We discussed possible etiologies. ? Fungal. Topical Lotrisone recommended. If not resolved in 2-3 weeks we need to consider derma evaluation and possible bx..   -     clotrimazole-betamethasone (LOTRISONE) cream; Apply 1 application topically 2 (two) times daily for 14 days.  Mixed hyperlipidemia Continue Atorvastatin 40 mg daily. Further recommendations according to FLP result.  Essential hypertension BP adequately controlled. Continue Lisinopril 20 mg daily.  Return in about 6 months (around 03/11/2021) for HTN.   Chesney Klimaszewski G. [place], MD  [facility]. [place] office.   A few things to remember from today's visit:   Routine general medical examination at a health care facility  Mixed hyperlipidemia - Plan: Lipid panel  Essential hypertension  Benign prostatic hyperplasia without lower urinary tract symptoms - Plan: PSA(Must document that pt has been informed of limitations of PSA testing.)  Screening for endocrine, metabolic and immunity disorder - Plan: Comprehensive metabolic panel, Hemoglobin A1c  If you need refills please call your pharmacy. Do not use My Chart to request refills or for acute issues that need immediate attention.   Apply small amount of cream on penis for up to 14 days if not gone in 4 weeks please let me know. Smoking cessation very important, it will be awesome if you try.  Please be sure medication list is accurate. If a new problem present, please set up appointment sooner than planned today.  At least 150 minutes of moderate exercise per week, daily brisk walking for 15-30 min is a good exercise option. Healthy diet low in saturated (animal) fats and sweets and  consisting of fresh fruits and vegetables, lean meats such as fish and white chicken and whole grains.  - Vaccines:  Tdap vaccine every 10 years.  Shingles vaccine recommended at age 63, could be given after 63 years of age but not sure about insurance coverage.  Pneumonia vaccines: Pneumovax at 665   -Screening recommendations for low/normal risk males:  Screening for diabetes at age 2 and every 3 years. Earlier screening if cardiovascular risk factors.   Lipid screening at 35 and every 3 years. Screening starts in younger males with cardiovascular risk factors.N/A  Colon cancer screening is now at age 43 but your insurance may not cover until age 50 .screening is recommended age 49.  Prostate cancer screening: some controversy, starts usually at 50: Rectal exam and PSA.  Aortic Abdominal Aneurism once between 3 and 7 years old if ever smoker.  Also recommended:  1. Dental visit- Brush and floss your teeth twice daily; visit your dentist twice a year. 2. Eye doctor- Get an eye exam at least every 2 years. 3. Helmet use- Always wear a helmet when riding a bicycle, motorcycle, rollerblading or skateboarding. 4. Safe sex- If you may be exposed to sexually transmitted infections, use a condom. 5. Seat belts- Seat belts can save your live; always wear one. 6.  Smoke/Carbon Monoxide detectors- These detectors need to be installed on the appropriate level of your home. Replace batteries at least once a year. 7. Skin cancer- When out in the sun please cover up and use sunscreen 15 SPF or higher. 8. Violence- If anyone is threatening or hurting you, please tell your healthcare provider.  9. Drink alcohol in moderation- Limit alcohol intake to one drink or less per day. Never drink and drive.

## 2020-09-11 ENCOUNTER — Ambulatory Visit (INDEPENDENT_AMBULATORY_CARE_PROVIDER_SITE_OTHER): Payer: BC Managed Care – PPO | Admitting: Family Medicine

## 2020-09-11 ENCOUNTER — Encounter: Payer: Self-pay | Admitting: Family Medicine

## 2020-09-11 VITALS — BP 128/80 | HR 70 | Resp 12 | Ht 67.1 in | Wt 180.4 lb

## 2020-09-11 DIAGNOSIS — Z0000 Encounter for general adult medical examination without abnormal findings: Secondary | ICD-10-CM

## 2020-09-11 DIAGNOSIS — Z130 Encounter for screening for diseases of the blood and blood-forming organs and certain disorders involving the immune mechanism: Secondary | ICD-10-CM

## 2020-09-11 DIAGNOSIS — N400 Enlarged prostate without lower urinary tract symptoms: Secondary | ICD-10-CM | POA: Diagnosis not present

## 2020-09-11 DIAGNOSIS — Z13228 Encounter for screening for other metabolic disorders: Secondary | ICD-10-CM | POA: Diagnosis not present

## 2020-09-11 DIAGNOSIS — E782 Mixed hyperlipidemia: Secondary | ICD-10-CM | POA: Diagnosis not present

## 2020-09-11 DIAGNOSIS — Z1329 Encounter for screening for other suspected endocrine disorder: Secondary | ICD-10-CM | POA: Diagnosis not present

## 2020-09-11 DIAGNOSIS — R21 Rash and other nonspecific skin eruption: Secondary | ICD-10-CM

## 2020-09-11 DIAGNOSIS — I10 Essential (primary) hypertension: Secondary | ICD-10-CM | POA: Diagnosis not present

## 2020-09-11 DIAGNOSIS — Z23 Encounter for immunization: Secondary | ICD-10-CM

## 2020-09-11 LAB — LIPID PANEL
Cholesterol: 123 mg/dL (ref 0–200)
HDL: 37.1 mg/dL — ABNORMAL LOW (ref >39.00)
LDL Cholesterol: 62 mg/dL (ref 0–99)
NonHDL: 86.15
Total CHOL/HDL Ratio: 3
Triglycerides: 121 mg/dL (ref 0.0–149.0)
VLDL: 24.2 mg/dL (ref 0.0–40.0)

## 2020-09-11 LAB — COMPREHENSIVE METABOLIC PANEL
ALT: 15 U/L (ref 0–53)
AST: 13 U/L (ref 0–37)
Albumin: 4.4 g/dL (ref 3.5–5.2)
Alkaline Phosphatase: 75 U/L (ref 39–117)
BUN: 19 mg/dL (ref 6–23)
CO2: 29 mEq/L (ref 19–32)
Calcium: 9.9 mg/dL (ref 8.4–10.5)
Chloride: 102 mEq/L (ref 96–112)
Creatinine, Ser: 1.09 mg/dL (ref 0.40–1.50)
GFR: 72.48 mL/min (ref >60.00)
Glucose, Bld: 100 mg/dL — ABNORMAL HIGH (ref 70–99)
Potassium: 4.2 mEq/L (ref 3.5–5.1)
Sodium: 137 mEq/L (ref 135–145)
Total Bilirubin: 0.6 mg/dL (ref 0.2–1.2)
Total Protein: 6.7 g/dL (ref 6.0–8.3)

## 2020-09-11 LAB — HEMOGLOBIN A1C: Hgb A1c MFr Bld: 6.2 % (ref 4.6–6.5)

## 2020-09-11 LAB — PSA: PSA: 3.28 ng/mL (ref 0.10–4.00)

## 2020-09-11 MED ORDER — CLOTRIMAZOLE-BETAMETHASONE 1-0.05 % EX CREA: 1.0000 "application " | TOPICAL_CREAM | Freq: Two times a day (BID) | CUTANEOUS | 0 refills | Status: AC

## 2020-09-11 NOTE — Assessment & Plan Note (Signed)
Continue Atorvastatin 40 mg daily. Further recommendations according to FLP result. 

## 2020-09-11 NOTE — Patient Instructions (Addendum)
A few things to remember from today's visit:   Routine general medical examination at a health care facility  Mixed hyperlipidemia - Plan: Lipid panel  Essential hypertension  Benign prostatic hyperplasia without lower urinary tract symptoms - Plan: PSA(Must document that pt has been informed of limitations of PSA testing.)  Screening for endocrine, metabolic and immunity disorder - Plan: Comprehensive metabolic panel, Hemoglobin A1c  If you need refills please call your pharmacy. Do not use My Chart to request refills or for acute issues that need immediate attention.   Apply small amount of cream on penis for up to 14 days if not gone in 4 weeks please let me know. Smoking cessation very important, it will be awesome if you try.  Please be sure medication list is accurate. If a new problem present, please set up appointment sooner than planned today.  At least 150 minutes of moderate exercise per week, daily brisk walking for 15-30 min is a good exercise option. Healthy diet low in saturated (animal) fats and sweets and consisting of fresh fruits and vegetables, lean meats such as fish and white chicken and whole grains.  - Vaccines:  Tdap vaccine every 10 years.  Shingles vaccine recommended at age 85, could be given after 63 years of age but not sure about insurance coverage.  Pneumonia vaccines: Pneumovax at 665   -Screening recommendations for low/normal risk males:  Screening for diabetes at age 74 and every 3 years. Earlier screening if cardiovascular risk factors.   Lipid screening at 35 and every 3 years. Screening starts in younger males with cardiovascular risk factors.N/A  Colon cancer screening is now at age 49 but your insurance may not cover until age 81 .screening is recommended age 72.  Prostate cancer screening: some controversy, starts usually at 50: Rectal exam and PSA.  Aortic Abdominal Aneurism once between 59 and 86 years old if ever  smoker.  Also recommended:  1. Dental visit- Brush and floss your teeth twice daily; visit your dentist twice a year. 2. Eye doctor- Get an eye exam at least every 2 years. 3. Helmet use- Always wear a helmet when riding a bicycle, motorcycle, rollerblading or skateboarding. 4. Safe sex- If you may be exposed to sexually transmitted infections, use a condom. 5. Seat belts- Seat belts can save your live; always wear one. 6. Smoke/Carbon Monoxide detectors- These detectors need to be installed on the appropriate level of your home. Replace batteries at least once a year. 7. Skin cancer- When out in the sun please cover up and use sunscreen 15 SPF or higher. 8. Violence- If anyone is threatening or hurting you, please tell your healthcare provider.  9. Drink alcohol in moderation- Limit alcohol intake to one drink or less per day. Never drink and drive.

## 2020-09-11 NOTE — Assessment & Plan Note (Signed)
BP adequately controlled. Continue Lisinopril 20 mg daily. 

## 2020-09-19 ENCOUNTER — Other Ambulatory Visit: Payer: Self-pay

## 2020-09-19 ENCOUNTER — Encounter (HOSPITAL_BASED_OUTPATIENT_CLINIC_OR_DEPARTMENT_OTHER): Payer: Self-pay | Admitting: Emergency Medicine

## 2020-09-19 ENCOUNTER — Emergency Department (HOSPITAL_BASED_OUTPATIENT_CLINIC_OR_DEPARTMENT_OTHER)
Admission: EM | Admit: 2020-09-19 | Discharge: 2020-09-19 | Disposition: A | Discharge status: Home / Self Care | Payer: BC Managed Care – PPO | Attending: Emergency Medicine | Admitting: Emergency Medicine

## 2020-09-19 DIAGNOSIS — I10 Essential (primary) hypertension: Secondary | ICD-10-CM | POA: Diagnosis not present

## 2020-09-19 DIAGNOSIS — F17210 Nicotine dependence, cigarettes, uncomplicated: Secondary | ICD-10-CM | POA: Insufficient documentation

## 2020-09-19 DIAGNOSIS — Z7982 Long term (current) use of aspirin: Secondary | ICD-10-CM | POA: Diagnosis not present

## 2020-09-19 DIAGNOSIS — R222 Localized swelling, mass and lump, trunk: Secondary | ICD-10-CM | POA: Diagnosis not present

## 2020-09-19 DIAGNOSIS — L03317 Cellulitis of buttock: Secondary | ICD-10-CM

## 2020-09-19 DIAGNOSIS — Z79899 Other long term (current) drug therapy: Secondary | ICD-10-CM | POA: Diagnosis not present

## 2020-09-19 DIAGNOSIS — L0231 Cutaneous abscess of buttock: Secondary | ICD-10-CM | POA: Insufficient documentation

## 2020-09-19 MED ORDER — DOXYCYCLINE HYCLATE 100 MG PO CAPS: 100.0000 mg | ORAL_CAPSULE | Freq: Two times a day (BID) | ORAL | 0 refills | Status: AC

## 2020-09-19 MED ORDER — LIDOCAINE-EPINEPHRINE 1 %-1:100000 IJ SOLN
10.0000 mL | Freq: Once | INTRAMUSCULAR | Status: AC
  Administered 2020-09-19: 10 mL
  Filled 2020-09-19: qty 1

## 2020-09-19 NOTE — ED Triage Notes (Signed)
Pt c/o "boil" on left butt cheek on Sunday. Pt states that it is red and raised. Pt states that his wife tried to pop the abscess and got some blood and small amount of puss. Pt aaox3, ambulatory with steady gait, VSS, GCS 15, NAD noted. Pt states that it is uncomfortable to sit at this time.

## 2020-09-19 NOTE — ED Provider Notes (Signed)
[facility] EMERGENCY DEPARTMENT Provider Note   CSN: [id number] Arrival date & time: 09/19/20  0630     History Chief Complaint  Patient presents with  . Abscess    Steven Cook is a 65 y.o. male.  65yo M w/ h/o HTN, HLD, GERD who p/w buttock abscess. Pt states that 3 days ago he noticed a small boil on R buttock that wife has tried to pop, got a small amount of puss. They've been applying warm compresses without improvement. He has had worsening pain when trying to sit down. No fevers. Denies hx of diabetes.   The history is provided by the patient.  Abscess Associated symptoms: no fever        Past Medical History:  Diagnosis Date  . Allergy   . GERD (gastroesophageal reflux disease)   . Hyperlipemia   . Hypertension   . Pneumonia     Patient Active Problem List   Diagnosis Date Noted  . Irregular heart rhythm 08/29/2019  . Prediabetes 02/25/2019  . Tobacco use disorder 02/23/2017  . Hearing loss 02/23/2017  . GERD (gastroesophageal reflux disease) 08/25/2016  . Mixed hyperlipidemia 08/25/2016  . Essential hypertension 08/25/2016  . BPH (benign prostatic hyperplasia) 08/06/2016    Past Surgical History:  Procedure Laterality Date  . BACK SURGERY         Family History  Problem Relation Age of Onset  . Arthritis Mother   . Hyperlipidemia Father   . Hypertension Father   . Diabetes Father   . Diabetes Maternal Grandfather     Social History   Tobacco Use  . Smoking status: Current Every Day Smoker    Packs/day: 0.50    Types: Cigarettes  . Smokeless tobacco: Current User  Substance Use Topics  . Alcohol use: Yes    Comment: occasional  . Drug use: No    Home Medications Prior to Admission medications   Medication Sig Start Date End Date Taking? Authorizing Provider  doxycycline (VIBRAMYCIN) 100 MG capsule Take 1 capsule (100 mg total) by mouth 2 (two) times daily for 7 days. 09/19/20 09/26/20 Yes Toluwani Yadav, Ambrose [place], MD   aspirin EC 81 MG tablet Take 81 mg by mouth daily. Swallow whole.    [provider]  atorvastatin (LIPITOR) 40 MG tablet TAKE 1 TABLET BY MOUTH EVERY DAY 09/10/20   [place], Betty G, MD  clotrimazole-betamethasone (LOTRISONE) cream Apply 1 application topically 2 (two) times daily for 14 days. 09/11/20 09/25/20  [place], Betty G, MD  fluticasone [facility]) 50 MCG/ACT nasal spray Place 2 sprays into both nostrils daily. 12/06/19   [place], Betty G, MD  hydrocortisone (ANUSOL-HC) 2.5 % rectal cream Place 1 application rectally daily as needed for hemorrhoids or anal itching. 08/27/18   [place], Betty G, MD  lisinopril (ZESTRIL) 20 MG tablet Take 1 tablet (20 mg total) by mouth daily. 02/24/20   [place], Betty G, MD  omeprazole (PRILOSEC) 20 MG capsule TAKE 1 CAPSULE BY MOUTH EVERY DAY 10/24/19   [place], Betty G, MD    Allergies    Nsaids  Review of Systems   Review of Systems  Constitutional: Negative for fever.  Skin: Positive for color change and wound.  Neurological: Negative for numbness.    Physical Exam Updated Vital Signs BP (!) 139/98 (BP Location: Right Arm)   Pulse 95   Temp 98.5 F (36.9 C) (Oral)   Resp 16   Ht 5\' 10"  (1.778 m)   Wt 81.6 kg  SpO2 98%   BMI 25.83 kg/m   Physical Exam Vitals and nursing note reviewed. Exam conducted with a chaperone present.  Constitutional:      General: He is not in acute distress.    Appearance: He is well-developed and well-nourished.  HENT:     Head: Normocephalic and atraumatic.  Eyes:     Conjunctiva/sclera: Conjunctivae normal.  Genitourinary:    Comments: Small scab on R lateral buttock distant from anus w/ 3cm area of surrounding induration, erythema, and warmth Musculoskeletal:     Cervical back: Neck supple.  Skin:    General: Skin is warm and dry.  Neurological:     Mental Status: He is alert and oriented to person, place, and time.  Psychiatric:        Mood and Affect: Mood and affect normal.        Judgment:  Judgment normal.     ED Results / Procedures / Treatments   Labs (all labs ordered are listed, but only abnormal results are displayed) Labs Reviewed - No data to display  EKG None  Radiology No results found.  Procedures .Marland KitchenIncision and Drainage  Date/Time: 09/19/2020 7:24 AM Performed by: Laurence Spates, MD Authorized by: Laurence Spates, MD   Consent:    Consent obtained:  Verbal   Consent given by:  Patient   Alternatives discussed:  No treatment Universal protocol:    Patient identity confirmed:  Verbally with patient Location:    Type:  Abscess   Size:  1 cm   Location:  Anogenital   Anogenital location: R buttock. Pre-procedure details:    Skin preparation:  Povidone-iodine Sedation:    Sedation type:  None Anesthesia:    Anesthesia method:  Local infiltration   Local anesthetic:  Lidocaine 1% WITH epi Procedure type:    Complexity:  Simple Procedure details:    Incision types:  Single straight   Incision depth:  Dermal   Wound management:  Probed and deloculated   Drainage:  Bloody   Drainage amount:  Scant   Wound treatment:  Wound left open   Packing materials:  None Post-procedure details:    Procedure completion:  Tolerated     Medications Ordered in ED Medications  lidocaine-EPINEPHrine (XYLOCAINE W/EPI) 1 %-1:100000 (with pres) injection 10 mL (10 mLs Other Given by Other 09/19/20 6144)    ED Course  I have reviewed the triage vital signs and the nursing notes.     MDM Rules/Calculators/A&P                          I&D at bedside, mostly blood, exam c/w cellulitis. No crepitus, extensive erythema, or erythema extending into perineum to suggest Fornier's gangrene or extensive abscess. Will start on doxycycline, discussed supportive care including warm compresses.  Reviewed return precautions. Final Clinical Impression(s) / ED Diagnoses Final diagnoses:  Abscess of buttock, right  Cellulitis of buttock    Rx / DC  Orders ED Discharge Orders         Ordered    doxycycline (VIBRAMYCIN) 100 MG capsule  2 times daily        09/19/20 0723           Michae Grimley, Ambrose [place], MD 09/19/20 [phone]

## 2020-10-05 ENCOUNTER — Encounter (HOSPITAL_BASED_OUTPATIENT_CLINIC_OR_DEPARTMENT_OTHER): Payer: Self-pay

## 2020-10-05 ENCOUNTER — Other Ambulatory Visit: Payer: Self-pay

## 2020-10-05 ENCOUNTER — Emergency Department (HOSPITAL_BASED_OUTPATIENT_CLINIC_OR_DEPARTMENT_OTHER)
Admission: EM | Admit: 2020-10-05 | Discharge: 2020-10-05 | Disposition: A | Discharge status: Home / Self Care | Payer: BC Managed Care – PPO | Attending: Emergency Medicine | Admitting: Emergency Medicine

## 2020-10-05 DIAGNOSIS — Z7982 Long term (current) use of aspirin: Secondary | ICD-10-CM | POA: Insufficient documentation

## 2020-10-05 DIAGNOSIS — F17210 Nicotine dependence, cigarettes, uncomplicated: Secondary | ICD-10-CM | POA: Insufficient documentation

## 2020-10-05 DIAGNOSIS — Z79899 Other long term (current) drug therapy: Secondary | ICD-10-CM | POA: Diagnosis not present

## 2020-10-05 DIAGNOSIS — I10 Essential (primary) hypertension: Secondary | ICD-10-CM | POA: Diagnosis not present

## 2020-10-05 DIAGNOSIS — L02511 Cutaneous abscess of right hand: Secondary | ICD-10-CM | POA: Diagnosis not present

## 2020-10-05 DIAGNOSIS — L03119 Cellulitis of unspecified part of limb: Secondary | ICD-10-CM

## 2020-10-05 DIAGNOSIS — L02519 Cutaneous abscess of unspecified hand: Secondary | ICD-10-CM

## 2020-10-05 MED ORDER — DOXYCYCLINE HYCLATE 100 MG PO CAPS: 100.0000 mg | ORAL_CAPSULE | Freq: Two times a day (BID) | ORAL | 0 refills | Status: DC

## 2020-10-05 MED ORDER — DOXYCYCLINE HYCLATE 100 MG PO TABS
100.0000 mg | ORAL_TABLET | Freq: Once | ORAL | Status: AC
  Administered 2020-10-05: 100 mg via ORAL
  Filled 2020-10-05: qty 1

## 2020-10-05 NOTE — ED Provider Notes (Signed)
[facility] EMERGENCY DEPARTMENT Provider Note   CSN: [id number] Arrival date & time: 10/05/20  1531     History Chief Complaint  Patient presents with  . Insect Bite    Steven Cook is a 65 y.o. male.  Patient presents to the emergency department with swelling and redness to the right hand starting 2 days ago.  Patient developed a small lump on the dorsum of the hand, proximal to the base of the thumb.  The area became more swollen and today the redness is spread up into his wrist.  He denies injury.  He thought he might have been bitten by an insect.  He has a history of boils in other locations.  No fevers, nausea or vomiting.  Area is sore at the area of swelling.  He tried to drain the area with a device that is used to remove splinters at work without success.        Past Medical History:  Diagnosis Date  . Allergy   . GERD (gastroesophageal reflux disease)   . Hyperlipemia   . Hypertension   . Pneumonia     Patient Active Problem List   Diagnosis Date Noted  . Irregular heart rhythm 08/29/2019  . Prediabetes 02/25/2019  . Tobacco use disorder 02/23/2017  . Hearing loss 02/23/2017  . GERD (gastroesophageal reflux disease) 08/25/2016  . Mixed hyperlipidemia 08/25/2016  . Essential hypertension 08/25/2016  . BPH (benign prostatic hyperplasia) 08/06/2016    Past Surgical History:  Procedure Laterality Date  . BACK SURGERY         Family History  Problem Relation Age of Onset  . Arthritis Mother   . Hyperlipidemia Father   . Hypertension Father   . Diabetes Father   . Diabetes Maternal Grandfather     Social History   Tobacco Use  . Smoking status: Current Every Day Smoker    Packs/day: 0.50    Types: Cigarettes  . Smokeless tobacco: Current User  Substance Use Topics  . Alcohol use: Yes    Comment: occasional  . Drug use: No    Home Medications Prior to Admission medications   Medication Sig Start Date End Date Taking?  Authorizing Provider  aspirin EC 81 MG tablet Take 81 mg by mouth daily. Swallow whole.    [provider]  atorvastatin (LIPITOR) 40 MG tablet TAKE 1 TABLET BY MOUTH EVERY DAY 09/10/20   [place], Betty G, MD  fluticasone [facility]) 50 MCG/ACT nasal spray Place 2 sprays into both nostrils daily. 12/06/19   [place], Betty G, MD  hydrocortisone (ANUSOL-HC) 2.5 % rectal cream Place 1 application rectally daily as needed for hemorrhoids or anal itching. 08/27/18   [place], Betty G, MD  lisinopril (ZESTRIL) 20 MG tablet Take 1 tablet (20 mg total) by mouth daily. 02/24/20   [place], Betty G, MD  omeprazole (PRILOSEC) 20 MG capsule TAKE 1 CAPSULE BY MOUTH EVERY DAY 10/24/19   [place], Betty G, MD    Allergies    Nsaids  Review of Systems   Review of Systems  Constitutional: Negative for fever.  Gastrointestinal: Negative for nausea and vomiting.  Skin: Positive for color change and wound.       Positive for abscess  Hematological: Negative for adenopathy.    Physical Exam Updated Vital Signs BP (!) 154/93 (BP Location: Left Arm)   Pulse 88   Temp 98.3 F (36.8 C) (Oral)   Resp 18   Ht 5\' 10"  (1.778 m)  Wt 81.6 kg   SpO2 98%   BMI 25.83 kg/m   Physical Exam Vitals and nursing note reviewed.  Constitutional:      Appearance: He is well-developed and well-nourished.  HENT:     Head: Normocephalic and atraumatic.  Eyes:     Conjunctiva/sclera: Conjunctivae normal.  Pulmonary:     Effort: No respiratory distress.  Musculoskeletal:     Cervical back: Normal range of motion and neck supple.  Skin:    General: Skin is warm and dry.     Comments: There is a 1cm area of induration that is not currently fluctuant on the dorsal aspect of the right hand, proximal to the thumb.  There is extension of cellulitis onto the dorsum of the hand and the wrist.  No active drainage.  No significant tenderness.    Neurological:     Mental Status: He is alert.  Psychiatric:        Mood and  Affect: Mood and affect normal.     ED Results / Procedures / Treatments   Labs (all labs ordered are listed, but only abnormal results are displayed) Labs Reviewed - No data to display  EKG None  Radiology No results found.  Procedures Procedures   Medications Ordered in ED Medications  doxycycline (VIBRA-TABS) tablet 100 mg (has no administration in time range)    ED Course  I have reviewed the triage vital signs and the nursing notes.  Pertinent labs & imaging results that were available during my care of the patient were reviewed by me and considered in my medical decision making (see chart for details).  Patient seen and examined.  Patient with small abscess and extension of cellulitis.  No lymphangitic spread.  Patient is appears well, nontoxic.  At this point there is no significant fluctuance to make me feel that incision and drainage would be fruitful.  The area will need to be closely monitored.  Patient encouraged to follow-up with his doctor on Monday return to the emergency department for recheck.  Will start on doxycycline.  First dose given here.  Vital signs reviewed and are as follows: BP (!) 154/93 (BP Location: Left Arm)   Pulse 88   Temp 98.3 F (36.8 C) (Oral)   Resp 18   Ht 5\' 10"  (1.778 m)   Wt 81.6 kg   SpO2 98%   BMI 25.83 kg/m   I wanted to take picture of the area for documentation purposes, however my Haiku would not connect at time of visit.      MDM Rules/Calculators/A&P                          Patient with small abscess and extension of cellulitis on the right hand.  No indication for or IV antibiotics today.  Patient started on oral antibiotics.  Central area is not fluctuant at the current time and I&D was not performed today.   Final Clinical Impression(s) / ED Diagnoses Final diagnoses:  Cellulitis and abscess of hand, except fingers and thumb    Rx / DC Orders ED Discharge Orders         Ordered    doxycycline  (VIBRAMYCIN) 100 MG capsule  2 times daily,   Status:  Discontinued        10/05/20 1629    doxycycline (VIBRAMYCIN) 100 MG capsule  2 times daily        10/05/20 1629  Renne Crigler, PA-C 10/05/20 1633    Tegeler, Canary Brim, MD 10/08/20 [phone]

## 2020-10-05 NOTE — ED Triage Notes (Signed)
Pt reports ?spider bite to right hand-redness/swelling x 2 days-NAD-steady gait

## 2020-10-05 NOTE — Discharge Instructions (Signed)
Please read and follow all provided instructions.  Your diagnoses today include:  1. Cellulitis and abscess of hand, except fingers and thumb     Tests performed today include: Vital signs. See below for your results today.   Medications prescribed:  Doxycycline - antibiotic  You have been prescribed an antibiotic medicine: take the entire course of medicine even if you are feeling better. Stopping early can cause the antibiotic not to work.  Take any prescribed medications only as directed.   Home care instructions:  Follow any educational materials contained in this packet  Follow-up instructions: Return to the Emergency Department or see your doctor in 48 hours for a recheck if your symptoms are not significantly improved.  Return instructions:  Return to the Emergency Department if you have: Fever Worsening symptoms Worsening pain Worsening swelling Redness of the skin that moves away from the affected area, especially if it streaks away from the affected area  Any other emergent concerns   Your vital signs today were: BP (!) 154/93 (BP Location: Left Arm)   Pulse 88   Temp 98.3 F (36.8 C) (Oral)   Resp 18   Ht 5\' 10"  (1.778 m)   Wt 81.6 kg   SpO2 98%   BMI 25.83 kg/m  If your blood pressure (BP) was elevated above 135/85 this visit, please have this repeated by your doctor within one month. --------------

## 2020-10-08 ENCOUNTER — Encounter (HOSPITAL_BASED_OUTPATIENT_CLINIC_OR_DEPARTMENT_OTHER): Payer: Self-pay | Admitting: Emergency Medicine

## 2020-10-08 ENCOUNTER — Other Ambulatory Visit: Payer: Self-pay

## 2020-10-08 ENCOUNTER — Emergency Department (HOSPITAL_BASED_OUTPATIENT_CLINIC_OR_DEPARTMENT_OTHER): Payer: BC Managed Care – PPO

## 2020-10-08 ENCOUNTER — Emergency Department (HOSPITAL_BASED_OUTPATIENT_CLINIC_OR_DEPARTMENT_OTHER)
Admission: EM | Admit: 2020-10-08 | Discharge: 2020-10-08 | Disposition: A | Discharge status: Home / Self Care | Payer: BC Managed Care – PPO | Attending: Emergency Medicine | Admitting: Emergency Medicine

## 2020-10-08 DIAGNOSIS — Z7982 Long term (current) use of aspirin: Secondary | ICD-10-CM | POA: Insufficient documentation

## 2020-10-08 DIAGNOSIS — M7989 Other specified soft tissue disorders: Secondary | ICD-10-CM | POA: Diagnosis not present

## 2020-10-08 DIAGNOSIS — S61451A Open bite of right hand, initial encounter: Secondary | ICD-10-CM | POA: Diagnosis not present

## 2020-10-08 DIAGNOSIS — L03113 Cellulitis of right upper limb: Secondary | ICD-10-CM | POA: Diagnosis not present

## 2020-10-08 DIAGNOSIS — M79641 Pain in right hand: Secondary | ICD-10-CM | POA: Diagnosis not present

## 2020-10-08 DIAGNOSIS — Z79899 Other long term (current) drug therapy: Secondary | ICD-10-CM | POA: Insufficient documentation

## 2020-10-08 DIAGNOSIS — L0390 Cellulitis, unspecified: Secondary | ICD-10-CM

## 2020-10-08 DIAGNOSIS — F17210 Nicotine dependence, cigarettes, uncomplicated: Secondary | ICD-10-CM | POA: Insufficient documentation

## 2020-10-08 DIAGNOSIS — I10 Essential (primary) hypertension: Secondary | ICD-10-CM | POA: Diagnosis not present

## 2020-10-08 NOTE — ED Triage Notes (Signed)
Pt here for recheck of possible insect bite.  Pt was in this ER on Friday.  Noted swelling and drainage.

## 2020-10-08 NOTE — ED Notes (Signed)
ED Provider at bedside. 

## 2020-10-08 NOTE — Discharge Instructions (Signed)
Continue bacitracin/Neosporin to the wound locally, finish antibiotics, continue warm water soaks as discussed as well.  Return if symptoms worsen.  Follow-up with hand surgery if needed as well.

## 2020-10-08 NOTE — ED Provider Notes (Signed)
[facility] EMERGENCY DEPARTMENT Provider Note   CSN: [id number] Arrival date & time: 10/08/20  5573     History Chief Complaint  Patient presents with  . Hand Problem    Steven Cook is a 65 y.o. male.  Patient here for follow-up for right hand pain.  Was seen several days ago for infection of his right hand.  He has not been using warm compresses but running hot water over his hand ever so often.  He has taken about 5 doses of doxycycline.  Overall he states that the redness has improved.  He still has pain at the focal site of the infection and does state that he has had some drainage from the wound.  He just want to make sure that things were getting better and was concerned maybe for foreign body as he does work with his hands and thought he might need an x-ray.  He is able to move his fingers and his wrist without much issues.  He does have some swelling throughout the hand however.  He states that he had redness that went up into his wrist but that has gone away.  The history is provided by the patient.  Hand Pain This is a new problem. The current episode started more than 2 days ago. The problem occurs constantly. The problem has been gradually improving. Nothing aggravates the symptoms. Nothing relieves the symptoms. Treatments tried: antibiotics. The treatment provided mild relief.       Past Medical History:  Diagnosis Date  . Allergy   . GERD (gastroesophageal reflux disease)   . Hyperlipemia   . Hypertension   . Pneumonia     Patient Active Problem List   Diagnosis Date Noted  . Irregular heart rhythm 08/29/2019  . Prediabetes 02/25/2019  . Tobacco use disorder 02/23/2017  . Hearing loss 02/23/2017  . GERD (gastroesophageal reflux disease) 08/25/2016  . Mixed hyperlipidemia 08/25/2016  . Essential hypertension 08/25/2016  . BPH (benign prostatic hyperplasia) 08/06/2016    Past Surgical History:  Procedure Laterality Date  . BACK SURGERY          Family History  Problem Relation Age of Onset  . Arthritis Mother   . Hyperlipidemia Father   . Hypertension Father   . Diabetes Father   . Diabetes Maternal Grandfather     Social History   Tobacco Use  . Smoking status: Current Every Day Smoker    Packs/day: 0.50    Types: Cigarettes  . Smokeless tobacco: Current User  Substance Use Topics  . Alcohol use: Yes    Comment: occasional  . Drug use: No    Home Medications Prior to Admission medications   Medication Sig Start Date End Date Taking? Authorizing Provider  aspirin EC 81 MG tablet Take 81 mg by mouth daily. Swallow whole.    [provider]  atorvastatin (LIPITOR) 40 MG tablet TAKE 1 TABLET BY MOUTH EVERY DAY 09/10/20   [place], Betty G, MD  doxycycline (VIBRAMYCIN) 100 MG capsule Take 1 capsule (100 mg total) by mouth 2 (two) times daily. 10/05/20   Renne Crigler, PA-C  fluticasone (FLONASE) 50 MCG/ACT nasal spray Place 2 sprays into both nostrils daily. 12/06/19   [place], Betty G, MD  hydrocortisone (ANUSOL-HC) 2.5 % rectal cream Place 1 application rectally daily as needed for hemorrhoids or anal itching. 08/27/18   [place], Betty G, MD  lisinopril (ZESTRIL) 20 MG tablet Take 1 tablet (20 mg total) by mouth daily. 02/24/20  [place], Betty G, MD  omeprazole (PRILOSEC) 20 MG capsule TAKE 1 CAPSULE BY MOUTH EVERY DAY 10/24/19   [place], Betty G, MD    Allergies    Nsaids  Review of Systems   Review of Systems  Constitutional: Negative for fever.  Musculoskeletal: Negative for arthralgias, back pain and joint swelling.  Skin: Positive for color change and wound.  Neurological: Negative for weakness and numbness.    Physical Exam Updated Vital Signs  ED Triage Vitals  Enc Vitals Group     BP 10/08/20 0738 (!) 165/92     Pulse Rate 10/08/20 0738 88     Resp 10/08/20 0738 18     Temp 10/08/20 0738 98.6 F (37 C)     Temp Source 10/08/20 0738 Oral     SpO2 10/08/20 0738 97 %     Weight 10/08/20  0729 179 lb 15.7 oz (81.6 kg)     Height 10/08/20 0729 5\' 10"  (1.778 m)     Head Circumference --      Peak Flow --      Pain Score 10/08/20 0728 5     Pain Loc --      Pain Edu? --      Excl. in GC? --     Physical Exam Constitutional:      General: He is not in acute distress.    Appearance: He is not ill-appearing.  Cardiovascular:     Pulses: Normal pulses.  Musculoskeletal:        General: Normal range of motion.     Comments: Tenderness to the base the right thumb near cellulitic changes, patient is able to flex and extend his wrist without any pain, he is able to open and close his fingers without any pain  Skin:    General: Skin is warm.     Capillary Refill: Capillary refill takes less than 2 seconds.     Comments: On the dorsum of his right hand at the proximal thumb there is small fluctuance with surrounding erythema with some drainage of serosanguineous, purulent fluid.  Redness is overall focal to the back of the right hand does not extend up into the wrist or to the palmar side of the hand he does have some trace swelling throughout the dorsum of the hand  Neurological:     General: No focal deficit present.     Mental Status: He is alert.     Sensory: No sensory deficit.     Motor: No weakness.     ED Results / Procedures / Treatments   Labs (all labs ordered are listed, but only abnormal results are displayed) Labs Reviewed - No data to display  EKG None  Radiology DG Hand Complete Right  Result Date: 10/08/2020 CLINICAL DATA:  Recent tick bite to the right hand. EXAM: RIGHT HAND - COMPLETE 3+ VIEW COMPARISON:  None. FINDINGS: Suspected soft tissue swelling about the dorsal aspect of the hand/MCP joints, best appreciated on the lateral radiograph. No associated radiopaque foreign body or subcutaneous emphysema. No discrete areas of osteolysis to suggest osteomyelitis. No fracture or dislocation. Joint spaces appear preserved. No erosions. No evidence of  chondrocalcinosis. IMPRESSION: Soft tissue swelling about dorsal aspect of the hand/MCP joints without associated radiopaque body, subcutaneous emphysema or radiographic evidence of osteomyelitis. Electronically Signed   By: 10/10/2020 M.D.   On: 10/08/2020 08:07    Procedures Procedures   Medications Ordered in ED Medications - No data to display  ED  Course  I have reviewed the triage vital signs and the nursing notes.  Pertinent labs & imaging results that were available during my care of the patient were reviewed by me and considered in my medical decision making (see chart for details).    MDM Rules/Calculators/A&P                          Abran Devarius Nelles is here for wound recheck.  Was diagnosed with a small abscess and cellulitis of his right hand.  Has been on doxycycline for about 5 doses.  Denies any fevers.  Overall he states that wound has improved.  Had redness extending up into his wrist that has gone away.  He has been running his wound under hot water but not using warm compresses.  Has not been using any topical bacitracin or Neosporin.  Overall there appears to be some minimal drainage on exam.  I was able to express a little bit of pus and serosanguineous drainage from focal area of the wound.  However does not appear to have any deep space abscess on exam.  Has normal movement of his fingers and his wrist and have no concern for septic joint.  Overall it appears that patient has small abscess/cellulitis that appears to be improving.  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  Will recommend that he continue antibiotics as he is only taking a few doses and has had overall improvement.  Educated him about the use of actual warm compresses several times a day and also to consider bacitracin or Neosporin ointment on the wound as well.  Overall suspect an infected hair follicle.  He had similar issue on his buttocks  couple weeks ago as well.  We will give him information to follow-up with hand if needed but could consider following up with his primary care doctor as well.  X-ray showed no foreign body.  No subcutaneous air.  No osteomyelitis.  Overall just some soft tissue swelling.  Understands wound care and return precautions and discharged from ED in good condition.  This chart was dictated using voice recognition software.  Despite best efforts to proofread,  errors can occur which can change the documentation meaning.   Final Clinical Impression(s) / ED Diagnoses Final diagnoses:  Cellulitis, unspecified cellulitis site    Rx / DC Orders ED Discharge Orders    None       Virgina Norfolk, DO 10/08/20 [phone]

## 2020-11-30 ENCOUNTER — Other Ambulatory Visit: Payer: Self-pay | Admitting: Family Medicine

## 2020-11-30 DIAGNOSIS — I10 Essential (primary) hypertension: Secondary | ICD-10-CM

## 2020-11-30 DIAGNOSIS — K219 Gastro-esophageal reflux disease without esophagitis: Secondary | ICD-10-CM

## 2021-03-08 NOTE — Progress Notes (Signed)
HPI:  Steven Cook is a 65 y.o. male, who is here today to follow on hypertension. He was last seen on 09/11/20. Since his last visit he has been in the ER, treated for cellulitis. He has had similar problems over the years and in different areas.  Hypertension:  Medications:Lisinopril 20 mg daily. BP readings at home:He is not checking. Side effects:None  Negative for unusual or severe headache, visual changes, exertional chest pain, dyspnea,  focal weakness, or edema. Lab Results  Component Value Date   CREATININE 1.09 09/11/2020   BUN 19 09/11/2020   NA 137 09/11/2020   K 4.2 09/11/2020   CL 102 09/11/2020   CO2 29 09/11/2020   Prediabetes: Negative for polydipsia,polyuria, or polyphagia. Lab Results  Component Value Date   HGBA1C 6.2 09/11/2020   Some days smoker. Usually smokes with his coffee and after meals. When he is at work, he does not feel like smoking. He is able to avoid smoking for 3-4 days, day 5th he is craving.  Review of Systems  Constitutional:  Negative for activity change, appetite change, fatigue and fever.  HENT:  Negative for mouth sores, nosebleeds and sore throat.   Respiratory:  Negative for cough and wheezing.   Gastrointestinal:  Negative for abdominal pain, nausea and vomiting.  Genitourinary:  Negative for decreased urine volume, dysuria and hematuria.  Skin:  Negative for pallor and rash.  Neurological:  Negative for syncope, facial asymmetry and weakness.  Rest see pertinent positives and negatives per HPI.  Current Outpatient Medications on File Prior to Visit  Medication Sig Dispense Refill   aspirin EC 81 MG tablet Take 81 mg by mouth daily. Swallow whole.     atorvastatin (LIPITOR) 40 MG tablet TAKE 1 TABLET BY MOUTH EVERY DAY 90 tablet 3   fluticasone (FLONASE) 50 MCG/ACT nasal spray Place 2 sprays into both nostrils daily. 16 g 6   lisinopril (ZESTRIL) 20 MG tablet TAKE 1 TABLET BY MOUTH EVERY DAY 90 tablet 2    omeprazole (PRILOSEC) 20 MG capsule TAKE 1 CAPSULE BY MOUTH EVERY DAY 90 capsule 3   No current facility-administered medications on file prior to visit.   Past Medical History:  Diagnosis Date   Allergy    GERD (gastroesophageal reflux disease)    Hyperlipemia    Hypertension    Pneumonia    Allergies  Allergen Reactions   Nsaids Anaphylaxis    Social History   Socioeconomic History   Marital status: Married    Spouse name: Not on file   Number of children: Not on file   Years of education: Not on file   Highest education level: Not on file  Occupational History   Not on file  Tobacco Use   Smoking status: Every Day    Packs/day: 0.50    Types: Cigarettes   Smokeless tobacco: Current  Substance and Sexual Activity   Alcohol use: Yes    Comment: occasional   Drug use: No   Sexual activity: Not on file  Other Topics Concern   Not on file  Social History Narrative   Not on file   Social Determinants of Health   Financial Resource Strain: Not on file  Food Insecurity: Not on file  Transportation Needs: Not on file  Physical Activity: Not on file  Stress: Not on file  Social Connections: Not on file    Vitals:   03/11/21 0705  BP: 128/80  Pulse: 83  Resp: 16  Temp: 98.8 F (37.1 C)  SpO2: 99%   Body mass index is 25.68 kg/m.  Physical Exam Vitals and nursing note reviewed.  Constitutional:      General: He is not in acute distress.    Appearance: He is well-developed.  HENT:     Head: Normocephalic and atraumatic.  Eyes:     Conjunctiva/sclera: Conjunctivae normal.  Cardiovascular:     Rate and Rhythm: Normal rate and regular rhythm.     Pulses:          Dorsalis pedis pulses are 2+ on the right side and 2+ on the left side.     Heart sounds: No murmur heard. Pulmonary:     Effort: Pulmonary effort is normal. No respiratory distress.     Breath sounds: Normal breath sounds.  Abdominal:     Palpations: Abdomen is soft. There is no  hepatomegaly or mass.     Tenderness: There is no abdominal tenderness.  Lymphadenopathy:     Cervical: No cervical adenopathy.  Skin:    General: Skin is warm.     Findings: No erythema or rash.  Neurological:     Mental Status: He is alert and oriented to person, place, and time.     Cranial Nerves: No cranial nerve deficit.     Gait: Gait normal.  Psychiatric:     Comments: Well groomed, good eye contact.   ASSESSMENT AND PLAN:  [name] was seen today for follow-up.  Diagnoses and all orders for this visit:  Orders Placed This Encounter  Procedures   Basic metabolic panel   Hemoglobin A1c   Lab Results  Component Value Date   HGBA1C 6.4 03/11/2021   Lab Results  Component Value Date   CREATININE 1.17 03/11/2021   BUN 20 03/11/2021   NA 137 03/11/2021   K 4.9 03/11/2021   CL 104 03/11/2021   CO2 26 03/11/2021   Essential hypertension BP adequately controlled. Continue current management: Lisinopril 20 mg daily. DASH/low salt diet to continue. Monitor BP at home. Eye exam recommended annually.  Tobacco use disorder We discussed adverse effects of tobacco use and benefit of smoking cessation. He has tried nicotine patches in theapast and helped.We reviewed other treatment options. He is going to try nicotine sublingual lozenges.  Prediabetes Continue a healthy life style for diabetes prevention. Further recommendations according to HgA1C results.  Return in about 27 weeks (around 09/16/2021) for cpe.  Add Dinapoli G. [place], MD  [facility]. [place] office.

## 2021-03-11 ENCOUNTER — Encounter: Payer: Self-pay | Admitting: Family Medicine

## 2021-03-11 ENCOUNTER — Ambulatory Visit: Payer: BC Managed Care – PPO | Admitting: Family Medicine

## 2021-03-11 ENCOUNTER — Telehealth: Payer: Self-pay | Admitting: Family Medicine

## 2021-03-11 ENCOUNTER — Other Ambulatory Visit: Payer: Self-pay

## 2021-03-11 VITALS — BP 128/80 | HR 83 | Temp 98.8°F | Resp 16 | Ht 70.0 in | Wt 179.0 lb

## 2021-03-11 DIAGNOSIS — F17200 Nicotine dependence, unspecified, uncomplicated: Secondary | ICD-10-CM | POA: Diagnosis not present

## 2021-03-11 DIAGNOSIS — R7303 Prediabetes: Secondary | ICD-10-CM | POA: Diagnosis not present

## 2021-03-11 DIAGNOSIS — Z23 Encounter for immunization: Secondary | ICD-10-CM

## 2021-03-11 DIAGNOSIS — I10 Essential (primary) hypertension: Secondary | ICD-10-CM | POA: Diagnosis not present

## 2021-03-11 LAB — BASIC METABOLIC PANEL
BUN: 20 mg/dL (ref 6–23)
CO2: 26 mEq/L (ref 19–32)
Calcium: 9.5 mg/dL (ref 8.4–10.5)
Chloride: 104 mEq/L (ref 96–112)
Creatinine, Ser: 1.17 mg/dL (ref 0.40–1.50)
GFR: 66.34 mL/min (ref >60.00)
Glucose, Bld: 115 mg/dL — ABNORMAL HIGH (ref 70–99)
Potassium: 4.9 mEq/L (ref 3.5–5.1)
Sodium: 137 mEq/L (ref 135–145)

## 2021-03-11 LAB — HEMOGLOBIN A1C: Hgb A1c MFr Bld: 6.4 % (ref 4.6–6.5)

## 2021-03-11 NOTE — Patient Instructions (Addendum)
A few things to remember from today's visit:  Essential hypertension - Plan: Basic metabolic panel  Prediabetes - Plan: Hemoglobin A1c  Tobacco use disorder  If you need refills please call your pharmacy. Do not use My Chart to request refills or for acute issues that need immediate attention. Managing the Challenge of Quitting Smoking Quitting smoking is a physical and mental challenge. You will face cravings, withdrawal symptoms, and temptation. Before quitting, work with your health care provider to make a plan that can help you manage quitting. Preparation canhelp you quit and keep you from giving in. How to manage lifestyle changes Managing stress Stress can make you want to smoke, and wanting to smoke may cause stress. It is important to find ways to manage your stress. You might try some of the following: Practice relaxation techniques. Breathe slowly and deeply, in through your nose and out through your mouth. Listen to music. Soak in a bath or take a shower. Imagine a peaceful place or vacation. Get some support. Talk with family or friends about your stress. Join a support group. Talk with a counselor or therapist. Get some physical activity. Go for a walk, run, or bike ride. Play a favorite sport. Practice yoga.  Medicines Talk with your health care provider about medicines that might help you dealwith cravings and make quitting easier for you. Relationships Social situations can be difficult when you are quitting smoking. To manage this, you can: Avoid parties and other social situations where people might be smoking. Avoid alcohol. Leave right away if you have the urge to smoke. Explain to your family and friends that you are quitting smoking. Ask for support and let them know you might be a bit grumpy. Plan activities where smoking is not an option. General instructions Be aware that many people gain weight after they quit smoking. However, not everyone does. To  keep from gaining weight, have a plan in place before you quit and stick to the plan after you quit. Your plan should include: Having healthy snacks. When you have a craving, it may help to: Eat popcorn, carrots, celery, or other cut vegetables. Chew sugar-free gum. Changing how you eat. Eat small portion sizes at meals. Eat 4-6 small meals throughout the day instead of 1-2 large meals a day. Be mindful when you eat. Do not watch television or do other things that might distract you as you eat. Exercising regularly. Make time to exercise each day. If you do not have time for a long workout, do short bouts of exercise for 5-10 minutes several times a day. Do some form of strengthening exercise, such as weight lifting. Do some exercise that gets your heart beating and causes you to breathe deeply, such as walking fast, running, swimming, or biking. This is very important. Drinking plenty of water or other low-calorie or no-calorie drinks. Drink 6-8 glasses of water daily.  How to recognize withdrawal symptoms Your body and mind may experience discomfort as you try to get used to not having nicotine in your system. These effects are called withdrawal symptoms. They may include: Feeling hungrier than normal. Having trouble concentrating. Feeling irritable or restless. Having trouble sleeping. Feeling depressed. Craving a cigarette. To manage withdrawal symptoms: Avoid places, people, and activities that trigger your cravings. Remember why you want to quit. Get plenty of sleep. Avoid coffee and other caffeinated drinks. These may worsen some of your symptoms. These symptoms may surprise you. But be assured that they are normal to havewhen  quitting smoking. How to manage cravings Come up with a plan for how to deal with your cravings. The plan should include the following: A definition of the specific situation you want to deal with. An alternative action you will take. A clear idea for how  this action will help. The name of someone who might help you with this. Cravings usually last for 5-10 minutes. Consider taking the following actions to help you with your plan to deal with cravings: Keep your mouth busy. Chew sugar-free gum. Suck on hard candies or a straw. Brush your teeth. Keep your hands and body busy. Change to a different activity right away. Squeeze or play with a ball. Do an activity or a hobby, such as making bead jewelry, practicing needlepoint, or working with wood. Mix up your normal routine. Take a short exercise break. Go for a quick walk or run up and down stairs. Focus on doing something kind or helpful for someone else. Call a friend or family member to talk during a craving. Join a support group. Contact a quitline. Where to find support To get help or find a support group: Call the National Cancer Institute's Smoking Quitline: 1-800-QUIT NOW 307-755-0945) Visit the website of the Substance Abuse and Mental Health Services Administration: SkateOasis.com.pt Text QUIT to SmokefreeTXT: 476546 Where to find more information Visit these websites to find more information on quitting smoking: National Cancer Institute: www.smokefree.gov American Lung Association: www.lung.org American Cancer Society: www.cancer.org Centers for Disease Control and Prevention: FootballExhibition.com.br American Heart Association: www.heart.org Contact a health care provider if: You want to change your plan for quitting. The medicines you are taking are not helping. Your eating feels out of control or you cannot sleep. Get help right away if: You feel depressed or become very anxious. Summary Quitting smoking is a physical and mental challenge. You will face cravings, withdrawal symptoms, and temptation to smoke again. Preparation can help you as you go through these challenges. Try different techniques to manage stress, handle social situations, and prevent weight gain. You can deal with  cravings by keeping your mouth busy (such as by chewing gum), keeping your hands and body busy, calling family or friends, or contacting a quitline for people who want to quit smoking. You can deal with withdrawal symptoms by avoiding places where people smoke, getting plenty of rest, and avoiding drinks with caffeine. This information is not intended to replace advice given to you by your health care provider. Make sure you discuss any questions you have with your healthcare provider. Document Revised: 05/17/2019 Document Reviewed: 05/17/2019 Elsevier Patient Education  2022 Elsevier Inc.   Please be sure medication list is accurate. If a new problem present, please set up appointment sooner than planned today.

## 2021-03-11 NOTE — Assessment & Plan Note (Signed)
Continue a healthy life style for diabetes prevention. Further recommendations according to HgA1C results.

## 2021-03-11 NOTE — Telephone Encounter (Signed)
Patient's cpe appointment needs to be r/s for a year and a day.  In error the one he has scheduled is scheduled for exactly one year.

## 2021-03-11 NOTE — Assessment & Plan Note (Signed)
We discussed adverse effects of tobacco use and benefit of smoking cessation. He has tried nicotine patches in theapast and helped.We reviewed other treatment options. He is going to try nicotine sublingual lozenges.

## 2021-03-11 NOTE — Assessment & Plan Note (Signed)
BP adequately controlled. ?Continue current management: Lisinopril 20 mg daily. ?DASH/low salt diet (to continue. ?Monitor BP at home. ?Eye exam recommended annually. ?

## 2021-05-06 ENCOUNTER — Other Ambulatory Visit: Payer: Self-pay | Admitting: Family Medicine

## 2021-05-06 DIAGNOSIS — F17200 Nicotine dependence, unspecified, uncomplicated: Secondary | ICD-10-CM

## 2021-05-06 MED ORDER — NICOTINE 14 MG/24HR TD PT24: 14.0000 mg | MEDICATED_PATCH | Freq: Every day | TRANSDERMAL | 0 refills | Status: DC

## 2021-07-01 ENCOUNTER — Other Ambulatory Visit (HOSPITAL_BASED_OUTPATIENT_CLINIC_OR_DEPARTMENT_OTHER): Payer: Self-pay

## 2021-07-01 ENCOUNTER — Emergency Department (HOSPITAL_BASED_OUTPATIENT_CLINIC_OR_DEPARTMENT_OTHER)
Admission: EM | Admit: 2021-07-01 | Discharge: 2021-07-01 | Disposition: A | Discharge status: Home / Self Care | Payer: BC Managed Care – PPO | Attending: Emergency Medicine | Admitting: Emergency Medicine

## 2021-07-01 ENCOUNTER — Encounter (HOSPITAL_BASED_OUTPATIENT_CLINIC_OR_DEPARTMENT_OTHER): Payer: Self-pay

## 2021-07-01 ENCOUNTER — Other Ambulatory Visit: Payer: Self-pay

## 2021-07-01 DIAGNOSIS — Z7982 Long term (current) use of aspirin: Secondary | ICD-10-CM | POA: Diagnosis not present

## 2021-07-01 DIAGNOSIS — R509 Fever, unspecified: Secondary | ICD-10-CM | POA: Diagnosis not present

## 2021-07-01 DIAGNOSIS — F17210 Nicotine dependence, cigarettes, uncomplicated: Secondary | ICD-10-CM | POA: Diagnosis not present

## 2021-07-01 DIAGNOSIS — Z79899 Other long term (current) drug therapy: Secondary | ICD-10-CM | POA: Diagnosis not present

## 2021-07-01 DIAGNOSIS — Z20822 Contact with and (suspected) exposure to covid-19: Secondary | ICD-10-CM | POA: Diagnosis not present

## 2021-07-01 DIAGNOSIS — I10 Essential (primary) hypertension: Secondary | ICD-10-CM | POA: Diagnosis not present

## 2021-07-01 DIAGNOSIS — J101 Influenza due to other identified influenza virus with other respiratory manifestations: Secondary | ICD-10-CM | POA: Diagnosis not present

## 2021-07-01 LAB — RESP PANEL BY RT-PCR (FLU A&B, COVID) ARPGX2
Influenza A by PCR: POSITIVE — AB
Influenza B by PCR: NEGATIVE
SARS Coronavirus 2 by RT PCR: NEGATIVE

## 2021-07-01 MED ORDER — OSELTAMIVIR PHOSPHATE 75 MG PO CAPS
75.0000 mg | ORAL_CAPSULE | Freq: Two times a day (BID) | ORAL | 0 refills | Status: DC
  Filled 2021-07-01: qty 10, 5d supply, fill #0

## 2021-07-01 NOTE — ED Triage Notes (Signed)
Pt c/o fever, body aches, headache, cough. Took tylenol at 1000.

## 2021-07-01 NOTE — Discharge Instructions (Addendum)
You tested positive for flu today.  This is a virus will get better on its own. I have given you Tamiflu which she can take for the next 5 days.  Please drink plenty of fluids and get plenty of rest.  Please return to the emergency department if you experience worsening symptoms, worsening fever, trouble breathing, or any other concerns you might have.

## 2021-07-01 NOTE — ED Provider Notes (Signed)
[facility] EMERGENCY DEPARTMENT Provider Note   CSN: [id number] Arrival date & time: 07/01/21  1309     History Chief Complaint  Patient presents with   Fever    Steven Cook is a 65 y.o. male who presents to the emergency department with a 1 day history of subjective fever up to 102, nasal congestion, general malaise, myalgias, and nonproductive cough.  He denies any sick contacts.  He denies any abdominal pain, nausea, vomiting, diarrhea.  Fevers improved with Tylenol.  He rates his nasal congestion moderate in severity.   Fever     Past Medical History:  Diagnosis Date   Allergy    GERD (gastroesophageal reflux disease)    Hyperlipemia    Hypertension    Pneumonia     Patient Active Problem List   Diagnosis Date Noted   Irregular heart rhythm 08/29/2019   Prediabetes 02/25/2019   Tobacco use disorder 02/23/2017   Hearing loss 02/23/2017   GERD (gastroesophageal reflux disease) 08/25/2016   Mixed hyperlipidemia 08/25/2016   Essential hypertension 08/25/2016   BPH (benign prostatic hyperplasia) 08/06/2016    Past Surgical History:  Procedure Laterality Date   BACK SURGERY         Family History  Problem Relation Age of Onset   Arthritis Mother    Hyperlipidemia Father    Hypertension Father    Diabetes Father    Diabetes Maternal Grandfather     Social History   Tobacco Use   Smoking status: Every Day    Packs/day: 0.50    Types: Cigarettes   Smokeless tobacco: Current  Substance Use Topics   Alcohol use: Yes    Comment: occasional   Drug use: No    Home Medications Prior to Admission medications   Medication Sig Start Date End Date Taking? Authorizing Provider  oseltamivir (TAMIFLU) 75 MG capsule Take 1 capsule (75 mg total) by mouth every 12 (twelve) hours. 07/01/21  Yes Honor Loh M, PA-C  aspirin EC 81 MG tablet Take 81 mg by mouth daily. Swallow whole.    [provider]  atorvastatin (LIPITOR) 40 MG  tablet TAKE 1 TABLET BY MOUTH EVERY DAY 09/10/20   [place], Betty G, MD  fluticasone [facility]) 50 MCG/ACT nasal spray Place 2 sprays into both nostrils daily. 12/06/19   [place], Betty G, MD  lisinopril (ZESTRIL) 20 MG tablet TAKE 1 TABLET BY MOUTH EVERY DAY 12/03/20   [place], Betty G, MD  nicotine (NICODERM CQ - DOSED IN MG/24 HOURS) 14 mg/24hr patch Place 1 patch (14 mg total) onto the skin daily. 05/06/21   [place], Betty G, MD  omeprazole (PRILOSEC) 20 MG capsule TAKE 1 CAPSULE BY MOUTH EVERY DAY 12/03/20   [place], Betty G, MD    Allergies    Nsaids  Review of Systems   Review of Systems  Constitutional:  Positive for fever.  All other systems reviewed and are negative.  Physical Exam Updated Vital Signs BP 136/85 (BP Location: Left Arm)   Pulse 97   Temp 98.6 F (37 C) (Oral)   Resp 18   Ht 5\' 10"  (1.778 m)   Wt 81.6 kg   SpO2 97%   BMI 25.83 kg/m   Physical Exam Vitals and nursing note reviewed.  Constitutional:      General: He is not in acute distress.    Appearance: Normal appearance.  HENT:     Head: Normocephalic and atraumatic.  Eyes:     General:  Right eye: No discharge.        Left eye: No discharge.  Cardiovascular:     Comments: Regular rate and rhythm.  S1/S2 are distinct without any evidence of murmur, rubs, or gallops.  Radial pulses are 2+ bilaterally.  Dorsalis pedis pulses are 2+ bilaterally.  No evidence of pedal edema. Pulmonary:     Comments: Clear to auscultation bilaterally.  Normal effort.  No respiratory distress.  No evidence of wheezes, rales, or rhonchi heard throughout. Abdominal:     General: Abdomen is flat. Bowel sounds are normal. There is no distension.     Tenderness: There is no abdominal tenderness. There is no guarding or rebound.  Musculoskeletal:        General: Normal range of motion.     Cervical back: Neck supple.  Skin:    General: Skin is warm and dry.     Findings: No rash.  Neurological:     General: No focal  deficit present.     Mental Status: He is alert.  Psychiatric:        Mood and Affect: Mood normal.        Behavior: Behavior normal.    ED Results / Procedures / Treatments   Labs (all labs ordered are listed, but only abnormal results are displayed) Labs Reviewed  RESP PANEL BY RT-PCR (FLU A&B, COVID) ARPGX2 - Abnormal; Notable for the following components:      Result Value   Influenza A by PCR POSITIVE (*)    All other components within normal limits    EKG None  Radiology No results found.  Procedures Procedures   Medications Ordered in ED Medications - No data to display  ED Course  I have reviewed the triage vital signs and the nursing notes.  Pertinent labs & imaging results that were available during my care of the patient were reviewed by me and considered in my medical decision making (see chart for details).    MDM Rules/Calculators/A&P                          Steven Cook is a 65 y.o. male who presents to the emergency department for for evaluation of flulike symptoms.  Patient tested positive for influenza A today.  His physical exam was normal and I have a low suspicion for overlying pneumonia at this time.  Given that he is in the window I will start him on Tamiflu. Strict return precautions were given.  He is safe for discharge   Final Clinical Impression(s) / ED Diagnoses Final diagnoses:  Influenza A    Rx / DC Orders ED Discharge Orders          Ordered    oseltamivir (TAMIFLU) 75 MG capsule  Every 12 hours        07/01/21 1509             Honor Loh Victor, PA-C 07/01/21 1510    Terald Sleeper, MD 07/01/21 1616

## 2021-08-19 ENCOUNTER — Other Ambulatory Visit: Payer: Self-pay

## 2021-08-19 ENCOUNTER — Other Ambulatory Visit (HOSPITAL_BASED_OUTPATIENT_CLINIC_OR_DEPARTMENT_OTHER): Payer: Self-pay

## 2021-08-19 ENCOUNTER — Emergency Department (HOSPITAL_BASED_OUTPATIENT_CLINIC_OR_DEPARTMENT_OTHER)
Admission: EM | Admit: 2021-08-19 | Discharge: 2021-08-19 | Disposition: A | Discharge status: Home / Self Care | Payer: BC Managed Care – PPO | Attending: Emergency Medicine | Admitting: Emergency Medicine

## 2021-08-19 ENCOUNTER — Emergency Department (HOSPITAL_BASED_OUTPATIENT_CLINIC_OR_DEPARTMENT_OTHER): Payer: BC Managed Care – PPO

## 2021-08-19 ENCOUNTER — Encounter (HOSPITAL_BASED_OUTPATIENT_CLINIC_OR_DEPARTMENT_OTHER): Payer: Self-pay

## 2021-08-19 DIAGNOSIS — Z87891 Personal history of nicotine dependence: Secondary | ICD-10-CM | POA: Diagnosis not present

## 2021-08-19 DIAGNOSIS — I10 Essential (primary) hypertension: Secondary | ICD-10-CM | POA: Diagnosis not present

## 2021-08-19 DIAGNOSIS — Z79899 Other long term (current) drug therapy: Secondary | ICD-10-CM | POA: Diagnosis not present

## 2021-08-19 DIAGNOSIS — J189 Pneumonia, unspecified organism: Secondary | ICD-10-CM | POA: Diagnosis not present

## 2021-08-19 DIAGNOSIS — Z20822 Contact with and (suspected) exposure to covid-19: Secondary | ICD-10-CM | POA: Insufficient documentation

## 2021-08-19 DIAGNOSIS — R509 Fever, unspecified: Secondary | ICD-10-CM | POA: Diagnosis not present

## 2021-08-19 DIAGNOSIS — Z7982 Long term (current) use of aspirin: Secondary | ICD-10-CM | POA: Insufficient documentation

## 2021-08-19 LAB — CBC
HCT: 43.1 % (ref 39.0–52.0)
Hemoglobin: 14.7 g/dL (ref 13.0–17.0)
MCH: 29.2 pg (ref 26.0–34.0)
MCHC: 34.1 g/dL (ref 30.0–36.0)
MCV: 85.5 fL (ref 80.0–100.0)
Platelets: 235 10*3/uL (ref 150–400)
RBC: 5.04 MIL/uL (ref 4.22–5.81)
RDW: 13.1 % (ref 11.5–15.5)
WBC: 14.2 10*3/uL — ABNORMAL HIGH (ref 4.0–10.5)
nRBC: 0 % (ref 0.0–0.2)

## 2021-08-19 LAB — BASIC METABOLIC PANEL
Anion gap: 10 (ref 5–15)
BUN: 18 mg/dL (ref 8–23)
CO2: 24 mmol/L (ref 22–32)
Calcium: 9.7 mg/dL (ref 8.9–10.3)
Chloride: 101 mmol/L (ref 98–111)
Creatinine, Ser: 1.19 mg/dL (ref 0.61–1.24)
GFR, Estimated: >60 mL/min (ref >60)
Glucose, Bld: 120 mg/dL — ABNORMAL HIGH (ref 70–99)
Potassium: 4.2 mmol/L (ref 3.5–5.1)
Sodium: 135 mmol/L (ref 135–145)

## 2021-08-19 LAB — RESP PANEL BY RT-PCR (FLU A&B, COVID) ARPGX2
Influenza A by PCR: NEGATIVE
Influenza B by PCR: NEGATIVE
SARS Coronavirus 2 by RT PCR: NEGATIVE

## 2021-08-19 LAB — TROPONIN I (HIGH SENSITIVITY): Troponin I (High Sensitivity): 3 ng/L (ref <18)

## 2021-08-19 MED ORDER — AMOXICILLIN-POT CLAVULANATE 875-125 MG PO TABS
1.0000 | ORAL_TABLET | Freq: Two times a day (BID) | ORAL | 0 refills | Status: DC
  Filled 2021-08-19: qty 14, 7d supply, fill #0

## 2021-08-19 MED ORDER — SODIUM CHLORIDE 0.9 % IV SOLN
1.0000 g | Freq: Once | INTRAVENOUS | Status: AC
  Administered 2021-08-19: 1 g via INTRAVENOUS
  Filled 2021-08-19: qty 10

## 2021-08-19 MED ORDER — SODIUM CHLORIDE 0.9 % IV SOLN
500.0000 mg | Freq: Once | INTRAVENOUS | Status: AC
  Administered 2021-08-19: 500 mg via INTRAVENOUS
  Filled 2021-08-19: qty 5

## 2021-08-19 NOTE — ED Triage Notes (Signed)
Pt arrives with reports of fever and chills starting last night, wife reports patient has been eating tums every night for the past 3 nights. Denies pain in chest. States he does feel congested.

## 2021-08-19 NOTE — ED Provider Notes (Signed)
[facility] Provider Note   CSN: [id number] Arrival date & time: 08/19/21  0950     History  Chief Complaint  Patient presents with   Fever    Steven Cook is a 66 y.o. male.  Patient is a 66 year old male with a history of GERD, hypertension, hyperlipidemia who presents with congestion and fevers.  This started 3 days ago.  He had a fever 3 days ago and some congestion.  A little bit of coughing.  He said he felt well over the weekend and was not having symptoms.  Today started having a fever again up to 101.  He has no shortness of breath.  No vomiting or diarrhea.  No abdominal pain.  No urinary symptoms.  He does have some myalgias and chills.  No known sick contacts.  He is a former smoker.  He quit smoking in August.  He is not on home oxygen.  No history of known underlying lung disease.      Home Medications Prior to Admission medications   Medication Sig Start Date End Date Taking? Authorizing Provider  amoxicillin-clavulanate (AUGMENTIN) 875-125 MG tablet Take 1 tablet by mouth every 12 (twelve) hours. 08/19/21  Yes Malvin Johns, MD  aspirin EC 81 MG tablet Take 81 mg by mouth daily. Swallow whole.    [provider]  atorvastatin (LIPITOR) 40 MG tablet TAKE 1 TABLET BY MOUTH EVERY DAY 09/10/20   [place], Betty G, MD  fluticasone [facility]) 50 MCG/ACT nasal spray Place 2 sprays into both nostrils daily. 12/06/19   [place], Betty G, MD  lisinopril (ZESTRIL) 20 MG tablet TAKE 1 TABLET BY MOUTH EVERY DAY 12/03/20   [place], Betty G, MD  nicotine (NICODERM CQ - DOSED IN MG/24 HOURS) 14 mg/24hr patch Place 1 patch (14 mg total) onto the skin daily. 05/06/21   [place], Betty G, MD  omeprazole (PRILOSEC) 20 MG capsule TAKE 1 CAPSULE BY MOUTH EVERY DAY 12/03/20   [place], Betty G, MD  oseltamivir (TAMIFLU) 75 MG capsule Take 1 capsule (75 mg total) by mouth every 12 (twelve) hours. 07/01/21   Hendricks Limes, PA-C      Allergies    Nsaids     Review of Systems   Review of Systems  Constitutional:  Positive for chills, fatigue and fever. Negative for diaphoresis.  HENT:  Positive for congestion. Negative for rhinorrhea and sneezing.   Eyes: Negative.   Respiratory:  Positive for cough. Negative for chest tightness and shortness of breath.   Cardiovascular:  Negative for chest pain and leg swelling.  Gastrointestinal:  Negative for abdominal pain, blood in stool, diarrhea, nausea and vomiting.  Genitourinary:  Negative for difficulty urinating, flank pain, frequency and hematuria.  Musculoskeletal:  Positive for myalgias. Negative for arthralgias and back pain.  Skin:  Negative for rash.  Neurological:  Negative for dizziness, speech difficulty, weakness, numbness and headaches.   Physical Exam Updated Vital Signs BP 125/85    Pulse 89    Temp 98.7 F (37.1 C) (Oral)    Resp 18    Ht 5\' 10"  (1.778 m)    Wt 81.6 kg    SpO2 97%    BMI 25.83 kg/m  Physical Exam Constitutional:      Appearance: He is well-developed.  HENT:     Head: Normocephalic and atraumatic.  Eyes:     Pupils: Pupils are equal, round, and reactive to light.  Cardiovascular:     Rate and Rhythm: Normal  rate and regular rhythm.     Heart sounds: Normal heart sounds.  Pulmonary:     Effort: Pulmonary effort is normal. No respiratory distress.     Breath sounds: Normal breath sounds. No wheezing or rales.  Chest:     Chest wall: No tenderness.  Abdominal:     General: Bowel sounds are normal.     Palpations: Abdomen is soft.     Tenderness: There is no abdominal tenderness. There is no guarding or rebound.  Musculoskeletal:        General: Normal range of motion.     Cervical back: Normal range of motion and neck supple.     Right lower leg: No edema.     Left lower leg: No edema.  Lymphadenopathy:     Cervical: No cervical adenopathy.  Skin:    General: Skin is warm and dry.     Findings: No rash.  Neurological:     Mental Status: He is  alert and oriented to person, place, and time.    ED Results / Procedures / Treatments   Labs (all labs ordered are listed, but only abnormal results are displayed) Labs Reviewed  BASIC METABOLIC PANEL - Abnormal; Notable for the following components:      Result Value   Glucose, Bld 120 (*)    All other components within normal limits  CBC - Abnormal; Notable for the following components:   WBC 14.2 (*)    All other components within normal limits  RESP PANEL BY RT-PCR (FLU A&B, COVID) ARPGX2  TROPONIN I (HIGH SENSITIVITY)    EKG EKG Interpretation  Date/Time:  Monday August 19 2021 10:17:08 EST Ventricular Rate:  98 PR Interval:  150 QRS Duration: 84 QT Interval:  330 QTC Calculation: 421 R Axis:   82 Text Interpretation: Normal sinus rhythm Nonspecific ST abnormality Abnormal ECG When compared with ECG of 03-Oct-2013 09:37, PREVIOUS ECG IS PRESENT since last tracing no significant change Confirmed by Malvin Johns [phone]) on 08/19/2021 11:44:19 AM  Radiology DG Chest 2 View  Result Date: 08/19/2021 CLINICAL DATA:  Fever, indigestion EXAM: CHEST - 2 VIEW COMPARISON:  10/03/2013 FINDINGS: Cardiac size is within normal limits. There are no signs of pulmonary edema. There are new patchy infiltrates in the right parahilar region and right lower lung fields. There is no consolidation in the peripheral lung fields. There is no pleural effusion or pneumothorax. Increase in AP diameter of chest suggests COPD. IMPRESSION: New patchy infiltrates are seen in the right parahilar region and right lower lung fields suggesting multifocal pneumonia. There is no pleural effusion. Electronically Signed   By: Elmer Picker M.D.   On: 08/19/2021 10:34    Procedures Procedures    Medications Ordered in ED Medications  cefTRIAXone (ROCEPHIN) 1 g in sodium chloride 0.9 % 100 mL IVPB (0 g Intravenous Stopped 08/19/21 1240)  azithromycin (ZITHROMAX) 500 mg in sodium chloride 0.9 % 250 mL IVPB  (0 mg Intravenous Stopped 08/19/21 1311)    ED Course/ Medical Decision Making/ A&P                           Medical Decision Making  Patient is a 66 year old male who presents with cough and fever.  He does have a little bit of pain in the center of his chest but sounds more like GERD.  His EKG does not show any ischemic changes.  His troponin is negative.  Repeat troponin was  not checked given his ongoing symptoms.  Chest x-ray shows evidence of right-sided infiltrates.  This was interpreted by me.  His labs show an elevated WBC count.  Otherwise nonconcerning.  He does report that he quit smoking in August.  His chart was reviewed.  Additional history is obtained from the wife.  He has no hypoxia.  No shortness of breath.  He was given IV Rocephin and Zithromax.  I feel that he is appropriate for outpatient trial of treatment for his community-acquired pneumonia.  His COVID/flu test were negative.  He was discharged home in good condition.  He was prescribed Augmentin.  Return precautions were given.  Final Clinical Impression(s) / ED Diagnoses Final diagnoses:  Community acquired pneumonia, unspecified laterality    Rx / DC Orders ED Discharge Orders          Ordered    amoxicillin-clavulanate (AUGMENTIN) 875-125 MG tablet  Every 12 hours        08/19/21 1347              Malvin Johns, MD 08/19/21 1353

## 2021-08-19 NOTE — Discharge Instructions (Addendum)
Follow-up with your primary care doctor within the next few days for recheck.  Return to emergency room if you have any worsening symptoms including increased shortness of breath, ongoing fevers, increased fatigue or other worsening symptoms.

## 2021-08-23 NOTE — Progress Notes (Signed)
HPI: [name] is a 66 y.o. male, who is here today to follow on recent ED visit. He went to the ED on 08/19/21, dx with pneumonia.  He was treated with Augmentin 875-125 mg every 12 hours for 7 days.  CXR 08/19/2021: New patchy infiltrates are seen in the right parahilar region and right lower lung fields suggesting multifocal pneumonia. There is no pleural effusion. Fever has resolved. Today is his last day of antibiotic.  He has tolerated medication well, has not noted GI side effects. Appetite is "great." Negative for chills, sore throat, dyspnea, or wheezing. + Former smoker.  Occasionally he has productive cough with "little" sputum, denies hemoptysis. He is supposed to go back to work tomorrow, he would like to have a couple days more to recover.  Lab Results  Component Value Date   WBC 14.2 (H) 08/19/2021   HGB 14.7 08/19/2021   HCT 43.1 08/19/2021   MCV 85.5 08/19/2021   PLT 235 08/19/2021   Lab Results  Component Value Date   CREATININE 1.19 08/19/2021   BUN 18 08/19/2021   NA 135 08/19/2021   K 4.2 08/19/2021   CL 101 08/19/2021   CO2 24 08/19/2021   COVID-19 and rapid flu negative. Troponin in normal range,3.  07/01/21 Dx'ed with influenza A, he has been fatigue since then. He feels like he did not fully recovered. Negative for CP, palpitations, diaphoresis.  Review of Systems  Constitutional:  Positive for activity change. Negative for appetite change.  Eyes:  Negative for redness and visual disturbance.  Gastrointestinal:  Negative for abdominal pain, nausea and vomiting.       Negative for changes in bowel habits.  Genitourinary:  Negative for decreased urine volume, dysuria and hematuria.  Neurological:  Negative for syncope, weakness and headaches.  Hematological:  Negative for adenopathy. Does not bruise/bleed easily.  Rest see pertinent positives and negatives per HPI.  Current Outpatient Medications on File Prior to Visit  Medication Sig  Dispense Refill   amoxicillin-clavulanate (AUGMENTIN) 875-125 MG tablet Take 1 tablet by mouth every 12 (twelve) hours. 14 tablet 0   aspirin EC 81 MG tablet Take 81 mg by mouth daily. Swallow whole.     atorvastatin (LIPITOR) 40 MG tablet TAKE 1 TABLET BY MOUTH EVERY DAY 90 tablet 3   lisinopril (ZESTRIL) 20 MG tablet TAKE 1 TABLET BY MOUTH EVERY DAY 90 tablet 2   nicotine (NICODERM CQ - DOSED IN MG/24 HOURS) 14 mg/24hr patch Place 1 patch (14 mg total) onto the skin daily. 28 patch 0   omeprazole (PRILOSEC) 20 MG capsule TAKE 1 CAPSULE BY MOUTH EVERY DAY 90 capsule 3   No current facility-administered medications on file prior to visit.    Past Medical History:  Diagnosis Date   Allergy    GERD (gastroesophageal reflux disease)    Hyperlipemia    Hypertension    Pneumonia    Allergies  Allergen Reactions   Nsaids Anaphylaxis    Social History   Socioeconomic History   Marital status: Married    Spouse name: Not on file   Number of children: Not on file   Years of education: Not on file   Highest education level: 12th grade  Occupational History   Not on file  Tobacco Use   Smoking status: Former    Packs/day: 0.50    Types: Cigarettes   Smokeless tobacco: Current  Vaping Use   Vaping Use: Some days  Substance and Sexual Activity  Alcohol use: Yes    Comment: occasional   Drug use: No   Sexual activity: Not on file  Other Topics Concern   Not on file  Social History Narrative   Not on file   Social Determinants of Health   Financial Resource Strain: Low Risk    Difficulty of Paying Living Expenses: Not hard at all  Food Insecurity: No Food Insecurity   Worried About Running Out of Food in the Last Year: Never true   [place] in the Last Year: Never true  Transportation Needs: No Transportation Needs   Lack of Transportation (Medical): No   Lack of Transportation (Non-Medical): No  Physical Activity: Unknown   Days of Exercise per Week: 7 days    Minutes of Exercise per Session: Patient refused  Stress: No Stress Concern Present   Feeling of Stress : Not at all  Social Connections: Unknown   Frequency of Communication with Friends and Family: Three times a week   Frequency of Social Gatherings with Friends and Family: Patient refused   Attends Religious Services: Patient refused   Active Member of Clubs or Organizations: No   Attends Archivist Meetings: Not on file   Marital Status: Married   Vitals:   08/26/21 1158  BP: 136/80  Pulse: 86  Resp: 16  SpO2: 97%   Body mass index is 26.26 kg/m.  Physical Exam Vitals and nursing note reviewed.  Constitutional:      General: He is not in acute distress.    Appearance: He is well-developed.  HENT:     Head: Normocephalic and atraumatic.  Eyes:     Conjunctiva/sclera: Conjunctivae normal.  Cardiovascular:     Rate and Rhythm: Normal rate and regular rhythm.     Heart sounds: No murmur heard. Pulmonary:     Effort: Pulmonary effort is normal. No respiratory distress.     Breath sounds: Normal breath sounds.  Abdominal:     Palpations: Abdomen is soft. There is no hepatomegaly or mass.     Tenderness: There is no abdominal tenderness.  Lymphadenopathy:     Cervical: No cervical adenopathy.  Skin:    General: Skin is warm.     Findings: No erythema or rash.  Neurological:     Mental Status: He is alert and oriented to person, place, and time.     Cranial Nerves: No cranial nerve deficit.     Gait: Gait normal.  Psychiatric:     Comments: Well groomed, good eye contact.   ASSESSMENT AND PLAN:  [name] was seen today for hospitalization follow-up.  Diagnoses and all orders for this visit: Orders Placed This Encounter  Procedures   CBC   Lab Results  Component Value Date   WBC 8.8 08/26/2021   HGB 14.4 08/26/2021   HCT 43.6 08/26/2021   MCV 85.9 08/26/2021   PLT 317.0 08/26/2021   Pneumonia of right lower lobe due to infectious  organism Clinically he has improved, today auscultation is negative. Complete abx treatment, some side effects discussed. We will plan on repeating CXR in 4-6 weeks, before if symptoms get worse. Instructed about warning signs. Letter for work provided.  Other fatigue We discussed possible etiologies. Explained that it is not uncommon to feel fatigue after infectious procees and my take longer to go back to baseline as ones gets older. Monitor for new symptoms. Recommend walking for 10 min daily as tolerated.  Return if symptoms worsen or fail to improve, for  Keep next appt..  Piccola Arico G. Martinique, MD  [facility]. [place] office.

## 2021-08-26 ENCOUNTER — Encounter: Payer: Self-pay | Admitting: Family Medicine

## 2021-08-26 ENCOUNTER — Ambulatory Visit: Payer: BC Managed Care – PPO | Admitting: Family Medicine

## 2021-08-26 VITALS — BP 136/80 | HR 86 | Resp 16 | Ht 70.0 in | Wt 183.0 lb

## 2021-08-26 DIAGNOSIS — R5383 Other fatigue: Secondary | ICD-10-CM

## 2021-08-26 DIAGNOSIS — J189 Pneumonia, unspecified organism: Secondary | ICD-10-CM

## 2021-08-26 LAB — CBC
HCT: 43.6 % (ref 39.0–52.0)
Hemoglobin: 14.4 g/dL (ref 13.0–17.0)
MCHC: 32.9 g/dL (ref 30.0–36.0)
MCV: 85.9 fl (ref 78.0–100.0)
Platelets: 317 10*3/uL (ref 150.0–400.0)
RBC: 5.08 Mil/uL (ref 4.22–5.81)
RDW: 13.5 % (ref 11.5–15.5)
WBC: 8.8 10*3/uL (ref 4.0–10.5)

## 2021-08-26 NOTE — Patient Instructions (Addendum)
A few things to remember from today's visit:  Pneumonia of right lower lobe due to infectious organism - Plan: CBC  Other fatigue - Plan: CBC  If you need refills please call your pharmacy. Do not use My Chart to request refills or for acute issues that need immediate attention.   We will repeat X ray next visit. Monitor for new symptoms.  Please be sure medication list is accurate. If a new problem present, please set up appointment sooner than planned today.

## 2021-09-01 ENCOUNTER — Other Ambulatory Visit: Payer: Self-pay | Admitting: Family Medicine

## 2021-09-11 ENCOUNTER — Encounter: Payer: BC Managed Care – PPO | Admitting: Family Medicine

## 2021-09-15 NOTE — Progress Notes (Signed)
HPI: [name] is a 66 y.o.male here today for his routine physical examination.  Last CPE: 09/11/20  Regular exercise 3 or more times per week: A lot of walking at work. Following a healthy diet: "too many sweets."Always cooks at home. Sleeps about 7-8 hours.  Chronic medical problems: Prediabetes,HLD,GERD,hearing loss,BPH,and HTN among some.  Immunization History  Administered Date(s) Administered   Influenza, Seasonal, Injecte, Preservative Fre 06/12/2015   Influenza,inj,Quad PF,6+ Mos 08/26/2016, 08/26/2017, 08/27/2018, 08/29/2019, 09/11/2020   Pneumococcal Polysaccharide-23 08/26/2017   Tdap 09/04/2011, 09/04/2011   Zoster Recombinat (Shingrix) 08/27/2018, 03/11/2021   Health Maintenance  Topic Date Due   INFLUENZA VACCINE  03/11/2021   COVID-19 Vaccine (1) 10/02/2021 (Originally 03/04/1956)   TETANUS/TDAP  08/12/2023   COLONOSCOPY (Pts 45-59yrs Insurance coverage will need to be confirmed)  08/11/2026   Hepatitis C Screening  Completed   HIV Screening  Completed   Zoster Vaccines- Shingrix  Completed   HPV VACCINES  Aged Out   Last prostate ca screening: 09/11/20, 3.28 Nocturia x 0.  -Negative for high alcohol intake. + Former smoker.  -Concerns and/or follow up today:   HLD: He is on Atorvastatin 40 mg daily. Tolerating medication well. Lab Results  Component Value Date   CHOL 123 09/11/2020   HDL 37.10 (L) 09/11/2020   LDLCALC 62 09/11/2020   TRIG 121.0 09/11/2020   CHOLHDL 3 09/11/2020   HTN on Lisinopril 20 mg daily. Lab Results  Component Value Date   CREATININE 1.19 08/19/2021   BUN 18 08/19/2021   NA 135 08/19/2021   K 4.2 08/19/2021   CL 101 08/19/2021   CO2 24 08/19/2021   Prediabetes: He has not noted polydipsia,polyuria,or polyphagia. Lab Results  Component Value Date   HGBA1C 6.4 03/11/2021   Dx and treated for CAP 08/19/21. Completed treatment worth Augmentin. Symptoms have resolved. 08/19/21 CXR with new patchy infiltrates  are seen in the right parahilar region and right lower lung fields suggesting multifocal pneumonia.   Review of Systems  Constitutional:  Negative for activity change, appetite change, fatigue and fever.  HENT:  Negative for nosebleeds, sore throat, trouble swallowing and voice change.   Eyes:  Negative for redness and visual disturbance.  Respiratory:  Negative for cough, shortness of breath and wheezing.   Cardiovascular:  Negative for chest pain, palpitations and leg swelling.  Gastrointestinal:  Negative for abdominal pain, blood in stool, nausea and vomiting.  Endocrine: Negative for cold intolerance and heat intolerance.  Genitourinary:  Negative for decreased urine volume, dysuria, genital sores, hematuria and testicular pain.  Musculoskeletal:  Negative for gait problem and myalgias.  Skin:  Negative for color change and rash.  Allergic/Immunologic: Positive for environmental allergies.  Neurological:  Negative for syncope, weakness and headaches.  Hematological:  Negative for adenopathy. Does not bruise/bleed easily.  Psychiatric/Behavioral:  Negative for confusion and sleep disturbance. The patient is not nervous/anxious.    Current Outpatient Medications on File Prior to Visit  Medication Sig Dispense Refill   aspirin EC 81 MG tablet Take 81 mg by mouth daily. Swallow whole.     atorvastatin (LIPITOR) 40 MG tablet TAKE 1 TABLET BY MOUTH EVERY DAY 90 tablet 3   lisinopril (ZESTRIL) 20 MG tablet TAKE 1 TABLET BY MOUTH EVERY DAY 90 tablet 2   nicotine (NICODERM CQ - DOSED IN MG/24 HOURS) 14 mg/24hr patch Place 1 patch (14 mg total) onto the skin daily. 28 patch 0   omeprazole (PRILOSEC) 20 MG capsule TAKE 1  CAPSULE BY MOUTH EVERY DAY 90 capsule 3   No current facility-administered medications on file prior to visit.   Past Medical History:  Diagnosis Date   Allergy    GERD (gastroesophageal reflux disease)    Hyperlipemia    Hypertension    Pneumonia    Past Surgical  History:  Procedure Laterality Date   BACK SURGERY      Allergies  Allergen Reactions   Nsaids Anaphylaxis   Family History  Problem Relation Age of Onset   Arthritis Mother    Hyperlipidemia Father    Hypertension Father    Diabetes Father    Diabetes Maternal Grandfather    Social History   Socioeconomic History   Marital status: Married    Spouse name: Not on file   Number of children: Not on file   Years of education: Not on file   Highest education level: 12th grade  Occupational History   Not on file  Tobacco Use   Smoking status: Former    Packs/day: 0.50    Types: Cigarettes   Smokeless tobacco: Current  Vaping Use   Vaping Use: Some days  Substance and Sexual Activity   Alcohol use: Yes    Comment: occasional   Drug use: No   Sexual activity: Not on file  Other Topics Concern   Not on file  Social History Narrative   Not on file   Social Determinants of Health   Financial Resource Strain: Low Risk    Difficulty of Paying Living Expenses: Not hard at all  Food Insecurity: No Food Insecurity   Worried About Charity fundraiser in the Last Year: Never true   Ran Out of Food in the Last Year: Never true  Transportation Needs: No Transportation Needs   Lack of Transportation (Medical): No   Lack of Transportation (Non-Medical): No  Physical Activity: Unknown   Days of Exercise per Week: 7 days   Minutes of Exercise per Session: Patient refused  Stress: No Stress Concern Present   Feeling of Stress : Not at all  Social Connections: Unknown   Frequency of Communication with Friends and Family: Three times a week   Frequency of Social Gatherings with Friends and Family: Patient refused   Attends Religious Services: Patient refused   Active Member of Clubs or Organizations: No   Attends Archivist Meetings: Not on file   Marital Status: Married   Vitals:   09/16/21 0705  BP: 126/70  Pulse: 94  Resp: 16  SpO2: 98%   Body mass index is  26.19 kg/m.  Wt Readings from Last 3 Encounters:  09/16/21 182 lb 8 oz (82.8 kg)  08/26/21 183 lb (83 kg)  08/19/21 180 lb (81.6 kg)   Physical Exam Vitals and nursing note reviewed.  Constitutional:      General: He is not in acute distress.    Appearance: He is well-developed.  HENT:     Head: Normocephalic and atraumatic.     Right Ear: Tympanic membrane, ear canal and external ear normal.     Left Ear: Tympanic membrane, ear canal and external ear normal.     Ears:     Comments: Hearing aids, bilateral. Eyes:     Conjunctiva/sclera: Conjunctivae normal.     Pupils: Pupils are equal, round, and reactive to light.  Neck:     Thyroid: No thyromegaly.     Trachea: No tracheal deviation.  Cardiovascular:     Rate and Rhythm: Normal  rate. Rhythm regularly irregular.     Pulses:          Dorsalis pedis pulses are 2+ on the right side and 2+ on the left side.     Heart sounds: No murmur heard. Pulmonary:     Effort: Pulmonary effort is normal. No respiratory distress.     Breath sounds: Normal breath sounds.  Abdominal:     Palpations: Abdomen is soft. There is no hepatomegaly or mass.     Tenderness: There is no abdominal tenderness.  Genitourinary:    Comments: No concerns. Musculoskeletal:        General: No tenderness.     Cervical back: Normal range of motion.     Comments: No major deformities appreciated and no signs of synovitis.  Lymphadenopathy:     Cervical: No cervical adenopathy.     Upper Body:     Right upper body: No supraclavicular adenopathy.     Left upper body: No supraclavicular adenopathy.  Skin:    General: Skin is warm.     Findings: No erythema.  Neurological:     Mental Status: He is alert and oriented to person, place, and time.     Cranial Nerves: No cranial nerve deficit.     Sensory: No sensory deficit.     Coordination: Coordination normal.     Gait: Gait normal.     Deep Tendon Reflexes:     Reflex Scores:      Bicep reflexes are  2+ on the right side and 2+ on the left side.      Patellar reflexes are 2+ on the right side and 2+ on the left side.  ASSESSMENT AND PLAN:  [name] was seen today for annual exam.  Diagnoses and all orders for this visit: Orders Placed This Encounter  Procedures   DG Chest 2 View   PSA(Must document that pt has been informed of limitations of PSA testing.)   Hemoglobin A1c   Lipid panel   Hepatic function panel   Lab Results  Component Value Date   CHOL 125 09/16/2021   HDL 39.30 09/16/2021   LDLCALC 64 09/16/2021   TRIG 107.0 09/16/2021   CHOLHDL 3 09/16/2021   Lab Results  Component Value Date   HGBA1C 6.2 09/16/2021   Lab Results  Component Value Date   ALT 22 09/16/2021   AST 15 09/16/2021   ALKPHOS 74 09/16/2021   BILITOT 0.7 09/16/2021   Lab Results  Component Value Date   PSA 3.09 09/16/2021   Routine general medical examination at a health care facility We discussed the importance of regular physical activity and healthy diet for prevention of chronic illness and/or complications. Preventive guidelines reviewed. Vaccination up to date. Next CPE in a year. The ASCVD Risk score (Arnett DK, et al., 2019) failed to calculate for the following reasons:   The valid total cholesterol range is 130 to 320 mg/dL  Screening for endocrine, metabolic and immunity disorder -     Hemoglobin A1c  Pneumonia of right lower lobe due to infectious organism Symptoms have resolved and negative auscultation. CXR ordered today, further recommendations will be given accordingly.  Mixed hyperlipidemia Continue Atorvastatin 40 mg daily and low fat diet. Further recommendations according to FLP result.  BPH (benign prostatic hyperplasia) Stable. We discussed screening recommendations for prostate cancer, he would like PSA done.  Return in 6 months (on 03/16/2022) for HTN.  Steven Cook G. Martinique, MD  [facility]. [place] office.

## 2021-09-16 ENCOUNTER — Other Ambulatory Visit: Payer: Self-pay

## 2021-09-16 ENCOUNTER — Ambulatory Visit (INDEPENDENT_AMBULATORY_CARE_PROVIDER_SITE_OTHER): Payer: BC Managed Care – PPO | Admitting: Family Medicine

## 2021-09-16 ENCOUNTER — Encounter: Payer: Self-pay | Admitting: Family Medicine

## 2021-09-16 ENCOUNTER — Ambulatory Visit (INDEPENDENT_AMBULATORY_CARE_PROVIDER_SITE_OTHER): Payer: BC Managed Care – PPO

## 2021-09-16 VITALS — BP 126/70 | HR 94 | Resp 16 | Ht 70.0 in | Wt 182.5 lb

## 2021-09-16 DIAGNOSIS — Z130 Encounter for screening for diseases of the blood and blood-forming organs and certain disorders involving the immune mechanism: Secondary | ICD-10-CM

## 2021-09-16 DIAGNOSIS — E782 Mixed hyperlipidemia: Secondary | ICD-10-CM | POA: Diagnosis not present

## 2021-09-16 DIAGNOSIS — Z1329 Encounter for screening for other suspected endocrine disorder: Secondary | ICD-10-CM

## 2021-09-16 DIAGNOSIS — N400 Enlarged prostate without lower urinary tract symptoms: Secondary | ICD-10-CM | POA: Diagnosis not present

## 2021-09-16 DIAGNOSIS — J189 Pneumonia, unspecified organism: Secondary | ICD-10-CM | POA: Diagnosis not present

## 2021-09-16 DIAGNOSIS — Z0000 Encounter for general adult medical examination without abnormal findings: Secondary | ICD-10-CM

## 2021-09-16 DIAGNOSIS — R918 Other nonspecific abnormal finding of lung field: Secondary | ICD-10-CM | POA: Diagnosis not present

## 2021-09-16 DIAGNOSIS — Z13228 Encounter for screening for other metabolic disorders: Secondary | ICD-10-CM | POA: Diagnosis not present

## 2021-09-16 LAB — LIPID PANEL
Cholesterol: 125 mg/dL (ref 0–200)
HDL: 39.3 mg/dL (ref >39.00)
LDL Cholesterol: 64 mg/dL (ref 0–99)
NonHDL: 85.58
Total CHOL/HDL Ratio: 3
Triglycerides: 107 mg/dL (ref 0.0–149.0)
VLDL: 21.4 mg/dL (ref 0.0–40.0)

## 2021-09-16 LAB — HEPATIC FUNCTION PANEL
ALT: 22 U/L (ref 0–53)
AST: 15 U/L (ref 0–37)
Albumin: 4.3 g/dL (ref 3.5–5.2)
Alkaline Phosphatase: 74 U/L (ref 39–117)
Bilirubin, Direct: 0.1 mg/dL (ref 0.0–0.3)
Total Bilirubin: 0.7 mg/dL (ref 0.2–1.2)
Total Protein: 6.2 g/dL (ref 6.0–8.3)

## 2021-09-16 LAB — HEMOGLOBIN A1C: Hgb A1c MFr Bld: 6.2 % (ref 4.6–6.5)

## 2021-09-16 LAB — PSA: PSA: 3.09 ng/mL (ref 0.10–4.00)

## 2021-09-16 NOTE — Assessment & Plan Note (Signed)
Stable. We discussed screening recommendations for prostate cancer, he would like PSA done.

## 2021-09-16 NOTE — Patient Instructions (Addendum)
A few things to remember from today's visit:  Routine general medical examination at a health care facility  Mixed hyperlipidemia - Plan: Lipid panel, Hepatic function panel  Screening for endocrine, metabolic and immunity disorder - Plan: Hemoglobin A1c  Benign prostatic hyperplasia without lower urinary tract symptoms - Plan: PSA(Must document that pt has been informed of limitations of PSA testing.)  Pneumonia of right lower lobe due to infectious organism - Plan: DG Chest 2 View  If you need refills please call your pharmacy. Do not use My Chart to request refills or for acute issues that need immediate attention.   Please be sure medication list is accurate. If a new problem present, please set up appointment sooner than planned today.        Health Maintenance, Male Adopting a healthy lifestyle and getting preventive care are important in promoting health and wellness. Ask your health care provider about: The right schedule for you to have regular tests and exams. Things you can do on your own to prevent diseases and keep yourself healthy. What should I know about diet, weight, and exercise? Eat a healthy diet  Eat a diet that includes plenty of vegetables, fruits, low-fat dairy products, and lean protein. Do not eat a lot of foods that are high in solid fats, added sugars, or sodium. Maintain a healthy weight Body mass index (BMI) is a measurement that can be used to identify possible weight problems. It estimates body fat based on height and weight. Your health care provider can help determine your BMI and help you achieve or maintain a healthy weight. Get regular exercise Get regular exercise. This is one of the most important things you can do for your health. Most adults should: Exercise for at least 150 minutes each week. The exercise should increase your heart rate and make you sweat (moderate-intensity exercise). Do strengthening exercises at least twice a week.  This is in addition to the moderate-intensity exercise. Spend less time sitting. Even light physical activity can be beneficial. Watch cholesterol and blood lipids Have your blood tested for lipids and cholesterol at 64 years of age, then have this test every 5 years. You may need to have your cholesterol levels checked more often if: Your lipid or cholesterol levels are high. You are older than 64 years of age. You are at high risk for heart disease. What should I know about cancer screening? Many types of cancers can be detected early and may often be prevented. Depending on your health history and family history, you may need to have cancer screening at various ages. This may include screening for: Colorectal cancer. Prostate cancer. Skin cancer. Lung cancer. What should I know about heart disease, diabetes, and high blood pressure? Blood pressure and heart disease High blood pressure causes heart disease and increases the risk of stroke. This is more likely to develop in people who have high blood pressure readings or are overweight. Talk with your health care provider about your target blood pressure readings. Have your blood pressure checked: Every 3-5 years if you are 50-6 years of age. Every year if you are 23 years old or older. If you are between the ages of 41 and 40 and are a current or former smoker, ask your health care provider if you should have a one-time screening for abdominal aortic aneurysm (AAA). Diabetes Have regular diabetes screenings. This checks your fasting blood sugar level. Have the screening done: Once every three years after age 34 if you  are at a normal weight and have a low risk for diabetes. More often and at a younger age if you are overweight or have a high risk for diabetes. What should I know about preventing infection? Hepatitis B If you have a higher risk for hepatitis B, you should be screened for this virus. Talk with your health care provider  to find out if you are at risk for hepatitis B infection. Hepatitis C Blood testing is recommended for: Everyone born from [birth year] through 1963. Anyone with known risk factors for hepatitis C. Sexually transmitted infections (STIs) You should be screened each year for STIs, including gonorrhea and chlamydia, if: You are sexually active and are younger than 64 years of age. You are older than 64 years of age and your health care provider tells you that you are at risk for this type of infection. Your sexual activity has changed since you were last screened, and you are at increased risk for chlamydia or gonorrhea. Ask your health care provider if you are at risk. Ask your health care provider about whether you are at high risk for HIV. Your health care provider may recommend a prescription medicine to help prevent HIV infection. If you choose to take medicine to prevent HIV, you should first get tested for HIV. You should then be tested every 3 months for as long as you are taking the medicine. Follow these instructions at home: Alcohol use Do not drink alcohol if your health care provider tells you not to drink. If you drink alcohol: Limit how much you have to 0-2 drinks a day. Know how much alcohol is in your drink. In the [place] one drink equals one 12 oz bottle of beer (355 mL), one 5 oz glass of wine (148 mL), or one 1 oz glass of hard liquor (44 mL). Lifestyle Do not use any products that contain nicotine or tobacco. These products include cigarettes, chewing tobacco, and vaping devices, such as e-cigarettes. If you need help quitting, ask your health care provider. Do not use street drugs. Do not share needles. Ask your health care provider for help if you need support or information about quitting drugs. General instructions Schedule regular health, dental, and eye exams. Stay current with your vaccines. Tell your health care provider if: You often feel depressed. You have ever been  abused or do not feel safe at home. Summary Adopting a healthy lifestyle and getting preventive care are important in promoting health and wellness. Follow your health care provider's instructions about healthy diet, exercising, and getting tested or screened for diseases. Follow your health care provider's instructions on monitoring your cholesterol and blood pressure. This information is not intended to replace advice given to you by your health care provider. Make sure you discuss any questions you have with your health care provider. Document Revised: 12/17/2020 Document Reviewed: 12/17/2020 Elsevier Patient Education  [place].

## 2021-09-16 NOTE — Assessment & Plan Note (Addendum)
Continue Atorvastatin 40 mg daily and low fat diet. Further recommendations according to FLP result. 

## 2021-10-23 ENCOUNTER — Other Ambulatory Visit: Payer: Self-pay | Admitting: Family Medicine

## 2021-10-23 DIAGNOSIS — I10 Essential (primary) hypertension: Secondary | ICD-10-CM

## 2021-10-31 DIAGNOSIS — M25562 Pain in left knee: Secondary | ICD-10-CM | POA: Diagnosis not present

## 2021-11-26 ENCOUNTER — Other Ambulatory Visit: Payer: Self-pay | Admitting: Family Medicine

## 2021-11-26 DIAGNOSIS — K219 Gastro-esophageal reflux disease without esophagitis: Secondary | ICD-10-CM

## 2022-03-16 NOTE — Progress Notes (Signed)
HPI: Steven Cook is a 66 y.o. male, who is here today for chronic disease management.  Last seen on 09/16/21 for his CPE. No new problems since his alst visit. Hypertension:  Medications:Lisinopril 20 mg daily. BP readings at home:Not checking. Side effects:None Negative for unusual or severe headache, visual changes, exertional chest pain, dyspnea,  focal weakness, or edema.  Lab Results  Component Value Date   CREATININE 1.19 08/19/2021   BUN 18 08/19/2021   NA 135 08/19/2021   K 4.2 08/19/2021   CL 101 08/19/2021   CO2 24 08/19/2021   Prediabetes: Negative for polydipsia,polyuria, or polyphagia. He follows a healthful diet, eats at home most of the time, grilled. He is active at work, walking.  Lab Results  Component Value Date   HGBA1C 6.2 09/16/2021   He has his last colonoscopy in 08/2016, polypectomy,tubular adenomatous polyp, 5 years follow up was recommended. Dr Kinnie Scales has retired. Hx of external hemorrhoids, he does not have symptoms frequently, s/p banding.  Review of Systems  Constitutional:  Negative for activity change, appetite change, fatigue and fever.  HENT:  Negative for nosebleeds, sore throat and trouble swallowing.   Respiratory:  Negative for cough and wheezing.   Gastrointestinal:  Negative for abdominal pain, nausea and vomiting.       Negative for changes in bowel movements.  Genitourinary:  Negative for decreased urine volume and hematuria.  Neurological:  Negative for syncope, facial asymmetry and weakness.  Rest of ROS see pertinent positives and negatives in HPI.  Current Outpatient Medications on File Prior to Visit  Medication Sig Dispense Refill   aspirin EC 81 MG tablet Take 81 mg by mouth daily. Swallow whole.     atorvastatin (LIPITOR) 40 MG tablet TAKE 1 TABLET BY MOUTH EVERY DAY 90 tablet 3   lisinopril (ZESTRIL) 20 MG tablet TAKE 1 TABLET BY MOUTH EVERY DAY 90 tablet 2   nicotine (NICODERM CQ - DOSED IN MG/24 HOURS) 14  mg/24hr patch Place 1 patch (14 mg total) onto the skin daily. 28 patch 0   omeprazole (PRILOSEC) 20 MG capsule TAKE 1 CAPSULE BY MOUTH EVERY DAY 90 capsule 3   No current facility-administered medications on file prior to visit.   Past Medical History:  Diagnosis Date   Allergy    GERD (gastroesophageal reflux disease)    Hyperlipemia    Hypertension    Pneumonia    Allergies  Allergen Reactions   Nsaids Anaphylaxis    Social History   Socioeconomic History   Marital status: Married    Spouse name: Not on file   Number of children: Not on file   Years of education: Not on file   Highest education level: 12th grade  Occupational History   Not on file  Tobacco Use   Smoking status: Former    Packs/day: 0.50    Types: Cigarettes    Start date: 09/04/1973    Quit date: 03/18/2021    Years since quitting: 0.9   Smokeless tobacco: Current  Vaping Use   Vaping Use: Some days  Substance and Sexual Activity   Alcohol use: Yes    Comment: occasional   Drug use: No   Sexual activity: Not on file  Other Topics Concern   Not on file  Social History Narrative   Not on file   Social Determinants of Health   Financial Resource Strain: Low Risk  (08/22/2021)   Overall Financial Resource Strain (CARDIA)    Difficulty of  Paying Living Expenses: Not hard at all  Food Insecurity: No Food Insecurity (08/22/2021)   Hunger Vital Sign    Worried About Running Out of Food in the Last Year: Never true    Ran Out of Food in the Last Year: Never true  Transportation Needs: No Transportation Needs (08/22/2021)   PRAPARE - Administrator, Civil Service (Medical): No    Lack of Transportation (Non-Medical): No  Physical Activity: Unknown (08/22/2021)   Exercise Vital Sign    Days of Exercise per Week: 7 days    Minutes of Exercise per Session: Patient refused  Stress: No Stress Concern Present (08/22/2021)   [employer] of Occupational Health - Occupational Stress  Questionnaire    Feeling of Stress : Not at all  Social Connections: Unknown (08/22/2021)   Social Connection and Isolation Panel [NHANES]    Frequency of Communication with Friends and Family: Three times a week    Frequency of Social Gatherings with Friends and Family: Patient refused    Attends Religious Services: Patient refused    Active Member of Clubs or Organizations: No    Attends Banker Meetings: Not on file    Marital Status: Married   Vitals:   03/17/22 0704  BP: 134/80  Pulse: 79  Resp: 12  Temp: 97.8 F (36.6 C)  SpO2: 97%  Body mass index is 26.58 kg/m.  Physical Exam Vitals and nursing note reviewed.  Constitutional:      General: He is not in acute distress.    Appearance: He is well-developed.  HENT:     Head: Normocephalic and atraumatic.  Eyes:     Conjunctiva/sclera: Conjunctivae normal.  Cardiovascular:     Rate and Rhythm: Normal rate and regular rhythm.     Pulses:          Dorsalis pedis pulses are 2+ on the right side and 2+ on the left side.     Heart sounds: No murmur heard. Pulmonary:     Effort: Pulmonary effort is normal. No respiratory distress.     Breath sounds: Normal breath sounds.  Abdominal:     Palpations: Abdomen is soft. There is no hepatomegaly or mass.     Tenderness: There is no abdominal tenderness.  Lymphadenopathy:     Cervical: No cervical adenopathy.  Skin:    General: Skin is warm.     Findings: No erythema or rash.  Neurological:     Mental Status: He is alert and oriented to person, place, and time.     Cranial Nerves: No cranial nerve deficit.     Gait: Gait normal.  Psychiatric:     Comments: Well groomed, good eye contact.   ASSESSMENT AND PLAN:  Steven Cook was seen today for follow-up.  Orders Placed This Encounter  Procedures   Basic metabolic panel   Hemoglobin A1c   Ambulatory referral to Gastroenterology   Lab Results  Component Value Date   CREATININE 1.29 03/17/2022   BUN 17  03/17/2022   NA 138 03/17/2022   K 4.3 03/17/2022   CL 103 03/17/2022   CO2 27 03/17/2022   Lab Results  Component Value Date   HGBA1C 6.5 03/17/2022   Prediabetes Continue a healthy life style for diabetes prevention. Further recommendations according to HgA1C result.  Essential hypertension BP adequately controlled. Continue current management: Lisinopril 20 mg daily. DASH/low salt diet to continue. Monitor BP at home. Eye exam is current,  Colon cancer screening -  Ambulatory referral to Gastroenterology  Return in about 27 weeks (around 09/22/2022) for cpe.  Shariff Lasky G. Martin[place], MD  [facility]. [place] office.

## 2022-03-17 ENCOUNTER — Encounter: Payer: Self-pay | Admitting: Family Medicine

## 2022-03-17 ENCOUNTER — Ambulatory Visit: Payer: BC Managed Care – PPO | Admitting: Family Medicine

## 2022-03-17 VITALS — BP 134/80 | HR 79 | Temp 97.8°F | Resp 12 | Ht 70.0 in | Wt 185.2 lb

## 2022-03-17 DIAGNOSIS — I10 Essential (primary) hypertension: Secondary | ICD-10-CM | POA: Diagnosis not present

## 2022-03-17 DIAGNOSIS — Z1211 Encounter for screening for malignant neoplasm of colon: Secondary | ICD-10-CM | POA: Diagnosis not present

## 2022-03-17 DIAGNOSIS — R7303 Prediabetes: Secondary | ICD-10-CM

## 2022-03-17 LAB — BASIC METABOLIC PANEL
BUN: 17 mg/dL (ref 6–23)
CO2: 27 mEq/L (ref 19–32)
Calcium: 9.3 mg/dL (ref 8.4–10.5)
Chloride: 103 mEq/L (ref 96–112)
Creatinine, Ser: 1.29 mg/dL (ref 0.40–1.50)
GFR: 58.59 mL/min — ABNORMAL LOW (ref >60.00)
Glucose, Bld: 109 mg/dL — ABNORMAL HIGH (ref 70–99)
Potassium: 4.3 mEq/L (ref 3.5–5.1)
Sodium: 138 mEq/L (ref 135–145)

## 2022-03-17 LAB — HEMOGLOBIN A1C: Hgb A1c MFr Bld: 6.5 % (ref 4.6–6.5)

## 2022-03-17 NOTE — Assessment & Plan Note (Signed)
BP adequately controlled. Continue current management: Lisinopril 20 mg daily. DASH/low salt diet to continue. Monitor BP at home. Eye exam is current,

## 2022-03-17 NOTE — Patient Instructions (Addendum)
A few things to remember from today's visit:   Essential hypertension - Plan: Basic metabolic panel  Prediabetes - Plan: Hemoglobin A1c  If you need refills please call your pharmacy. Do not use My Chart to request refills or for acute issues that need immediate attention.   You are due for colonoscopy, so please call Dr [id number] office. No changes today.  Please be sure medication list is accurate. If a new problem present, please set up appointment sooner than planned today.

## 2022-03-17 NOTE — Assessment & Plan Note (Signed)
Continue a healthy life style for diabetes prevention. Further recommendations according to HgA1C result. 

## 2022-03-21 ENCOUNTER — Emergency Department (HOSPITAL_BASED_OUTPATIENT_CLINIC_OR_DEPARTMENT_OTHER): Payer: BC Managed Care – PPO

## 2022-03-21 ENCOUNTER — Emergency Department (HOSPITAL_BASED_OUTPATIENT_CLINIC_OR_DEPARTMENT_OTHER)
Admission: EM | Admit: 2022-03-21 | Discharge: 2022-03-21 | Disposition: A | Discharge status: Home / Self Care | Payer: BC Managed Care – PPO | Attending: Emergency Medicine | Admitting: Emergency Medicine

## 2022-03-21 ENCOUNTER — Encounter (HOSPITAL_BASED_OUTPATIENT_CLINIC_OR_DEPARTMENT_OTHER): Payer: Self-pay | Admitting: Emergency Medicine

## 2022-03-21 ENCOUNTER — Other Ambulatory Visit (HOSPITAL_BASED_OUTPATIENT_CLINIC_OR_DEPARTMENT_OTHER): Payer: Self-pay

## 2022-03-21 DIAGNOSIS — Z79899 Other long term (current) drug therapy: Secondary | ICD-10-CM | POA: Diagnosis not present

## 2022-03-21 DIAGNOSIS — R42 Dizziness and giddiness: Secondary | ICD-10-CM | POA: Diagnosis not present

## 2022-03-21 DIAGNOSIS — I10 Essential (primary) hypertension: Secondary | ICD-10-CM | POA: Insufficient documentation

## 2022-03-21 DIAGNOSIS — Z87891 Personal history of nicotine dependence: Secondary | ICD-10-CM | POA: Diagnosis not present

## 2022-03-21 DIAGNOSIS — Z7982 Long term (current) use of aspirin: Secondary | ICD-10-CM | POA: Insufficient documentation

## 2022-03-21 DIAGNOSIS — J3489 Other specified disorders of nose and nasal sinuses: Secondary | ICD-10-CM | POA: Diagnosis not present

## 2022-03-21 LAB — CBC WITH DIFFERENTIAL/PLATELET
Abs Immature Granulocytes: 0.01 10*3/uL (ref 0.00–0.07)
Basophils Absolute: 0.1 10*3/uL (ref 0.0–0.1)
Basophils Relative: 1 %
Eosinophils Absolute: 0.2 10*3/uL (ref 0.0–0.5)
Eosinophils Relative: 3 %
HCT: 45.3 % (ref 39.0–52.0)
Hemoglobin: 15.3 g/dL (ref 13.0–17.0)
Immature Granulocytes: 0 %
Lymphocytes Relative: 34 %
Lymphs Abs: 2.6 10*3/uL (ref 0.7–4.0)
MCH: 28.4 pg (ref 26.0–34.0)
MCHC: 33.8 g/dL (ref 30.0–36.0)
MCV: 84.2 fL (ref 80.0–100.0)
Monocytes Absolute: 0.5 10*3/uL (ref 0.1–1.0)
Monocytes Relative: 7 %
Neutro Abs: 4.1 10*3/uL (ref 1.7–7.7)
Neutrophils Relative %: 55 %
Platelets: 232 10*3/uL (ref 150–400)
RBC: 5.38 MIL/uL (ref 4.22–5.81)
RDW: 13.1 % (ref 11.5–15.5)
WBC: 7.5 10*3/uL (ref 4.0–10.5)
nRBC: 0 % (ref 0.0–0.2)

## 2022-03-21 LAB — BASIC METABOLIC PANEL
Anion gap: 7 (ref 5–15)
BUN: 17 mg/dL (ref 8–23)
CO2: 25 mmol/L (ref 22–32)
Calcium: 9.1 mg/dL (ref 8.9–10.3)
Chloride: 105 mmol/L (ref 98–111)
Creatinine, Ser: 1.29 mg/dL — ABNORMAL HIGH (ref 0.61–1.24)
GFR, Estimated: >60 mL/min (ref >60)
Glucose, Bld: 157 mg/dL — ABNORMAL HIGH (ref 70–99)
Potassium: 4 mmol/L (ref 3.5–5.1)
Sodium: 137 mmol/L (ref 135–145)

## 2022-03-21 MED ORDER — MECLIZINE HCL 25 MG PO TABS: 25.0000 mg | ORAL_TABLET | Freq: Three times a day (TID) | ORAL | 0 refills | Status: DC | PRN

## 2022-03-21 MED ORDER — MECLIZINE HCL 25 MG PO TABS
25.0000 mg | ORAL_TABLET | Freq: Three times a day (TID) | ORAL | 0 refills | Status: DC | PRN
  Filled 2022-03-21: qty 30, 10d supply, fill #0

## 2022-03-21 NOTE — ED Provider Notes (Signed)
[facility] EMERGENCY DEPARTMENT Provider Note  CSN: [id number] Arrival date & time: 03/21/22 2202  Chief Complaint(s) Dizziness  HPI Din Bookwalter is a 66 y.o. male with history of hypertension, hyperlipidemia presenting to the emergency department with dizziness.  Patient reports his dizziness started yesterday.  He describes it as a sensation of being off balance or spinning sensation.  Denies sensation of fainting or feeling like he may faint.  Denies associated symptoms such as numbness, tingling, difficulty with speaking, difficulty walking, facial droop.  He reports his symptoms are not present at rest and only with head motion.  He denies symptoms currently.  Symptoms are mild.  No chest pain, abdominal pain, nausea, vomiting, syncope.   Past Medical History Past Medical History:  Diagnosis Date   Allergy    GERD (gastroesophageal reflux disease)    Hyperlipemia    Hypertension    Pneumonia    Patient Active Problem List   Diagnosis Date Noted   Irregular heart rhythm 08/29/2019   Prediabetes 02/25/2019   Tobacco use disorder 02/23/2017   Hearing loss 02/23/2017   GERD (gastroesophageal reflux disease) 08/25/2016   Mixed hyperlipidemia 08/25/2016   Essential hypertension 08/25/2016   BPH (benign prostatic hyperplasia) 08/06/2016   Home Medication(s) Prior to Admission medications   Medication Sig Start Date End Date Taking? Authorizing Provider  meclizine (ANTIVERT) 25 MG tablet Take 1 tablet (25 mg total) by mouth 3 (three) times daily as needed for dizziness. 03/21/22  Yes Lonell Grandchild, MD  aspirin EC 81 MG tablet Take 81 mg by mouth daily. Swallow whole.    [provider]  atorvastatin (LIPITOR) 40 MG tablet TAKE 1 TABLET BY MOUTH EVERY DAY 09/02/21   [place], Betty G, MD  lisinopril (ZESTRIL) 20 MG tablet TAKE 1 TABLET BY MOUTH EVERY DAY 10/23/21   [place], Betty G, MD  nicotine (NICODERM CQ - DOSED IN MG/24 HOURS) 14 mg/24hr patch Place  1 patch (14 mg total) onto the skin daily. 05/06/21   [place], Betty G, MD  omeprazole (PRILOSEC) 20 MG capsule TAKE 1 CAPSULE BY MOUTH EVERY DAY 11/26/21   [place], Betty G, MD                                                                                                                                    Past Surgical History Past Surgical History:  Procedure Laterality Date   BACK SURGERY     Family History Family History  Problem Relation Age of Onset   Arthritis Mother    Hyperlipidemia Father    Hypertension Father    Diabetes Father    Diabetes Maternal Grandfather     Social History Social History   Tobacco Use   Smoking status: Former    Packs/day: 0.50    Types: Cigarettes    Start date: 09/04/1973    Quit date: 03/18/2021    Years since quitting: 1.0  Smokeless tobacco: Current  Vaping Use   Vaping Use: Some days   Substances: Nicotine  Substance Use Topics   Alcohol use: Yes    Comment: rare   Drug use: No   Allergies Nsaids  Review of Systems Review of Systems  All other systems reviewed and are negative.   Physical Exam Vital Signs  I have reviewed the triage vital signs BP (!) 148/104   Pulse 72   Temp 98.2 F (36.8 C) (Oral)   Resp 18   Ht 5\' 10"  (1.778 m)   Wt 83.9 kg   SpO2 96%   BMI 26.54 kg/m  Physical Exam Vitals and nursing note reviewed.  Constitutional:      General: He is not in acute distress.    Appearance: Normal appearance.  HENT:     Mouth/Throat:     Mouth: Mucous membranes are moist.  Cardiovascular:     Rate and Rhythm: Normal rate and regular rhythm.  Pulmonary:     Effort: Pulmonary effort is normal. No respiratory distress.     Breath sounds: Normal breath sounds.  Abdominal:     General: Abdomen is flat.     Palpations: Abdomen is soft.     Tenderness: There is no abdominal tenderness.  Skin:    General: Skin is warm and dry.     Capillary Refill: Capillary refill takes less than 2 seconds.  Neurological:      Mental Status: He is alert and oriented to person, place, and time. Mental status is at baseline.     Comments: Cranial nerves II through XII intact, strength 5 out of 5 in the bilateral upper and lower extremities, no sensory deficit to light touch, no dysmetria on finger-nose-finger testing, ambulatory with steady gait.   Psychiatric:        Mood and Affect: Mood normal.        Behavior: Behavior normal.     ED Results and Treatments Labs (all labs ordered are listed, but only abnormal results are displayed) Labs Reviewed  BASIC METABOLIC PANEL - Abnormal; Notable for the following components:      Result Value   Glucose, Bld 157 (*)    Creatinine, Ser 1.29 (*)    All other components within normal limits  CBC WITH DIFFERENTIAL/PLATELET                                                                                                                          Radiology CT Head Wo Contrast  Result Date: 03/21/2022 CLINICAL DATA:  66 year old male with history of vertigo. EXAM: CT HEAD WITHOUT CONTRAST TECHNIQUE: Contiguous axial images were obtained from the base of the skull through the vertex without intravenous contrast. RADIATION DOSE REDUCTION: This exam was performed according to the departmental dose-optimization program which includes automated exposure control, adjustment of the mA and/or kV according to patient size and/or use of iterative reconstruction technique. COMPARISON:  Head CT 07/12/2010. FINDINGS: Brain: No evidence of  acute infarction, hemorrhage, hydrocephalus, extra-axial collection or mass lesion/mass effect. Vascular: No hyperdense vessel or unexpected calcification. Skull: Normal. Negative for fracture or focal lesion. Sinuses/Orbits: Mild multifocal mucosal thickening in the paranasal sinuses bilaterally, most severe in the anterior ethmoid cells. No air-fluid levels. Mastoids are well pneumatized bilaterally. Other: None. IMPRESSION: 1. No acute intracranial  abnormalities. The appearance of the brain is normal. Electronically Signed   By: Vinnie Langton M.D.   On: 03/21/2022 07:04    Pertinent labs & imaging results that were available during my care of the patient were reviewed by me and considered in my medical decision making (see MDM for details).  Medications Ordered in ED Medications - No data to display                                                                                                                                   Procedures Procedures  (including critical care time)  Medical Decision Making / ED Course   MDM:  66 year old male presenting to the emergency department with dizziness.  Patient well-appearing, physical exam with reassuring neurologic exam, no dysmetria, no weakness, normal gait.  Suspect peripheral vertigo given symptoms in the setting of head turning, with normal neurologic exam.  CT head negative, and no focal neurologic deficit to suggest stroke or intracranial tumor.  Symptoms not consistent with lightheadedness or presyncopal symptoms.  Labs reassuring.  Will discharge with meclizine.  Advise close follow-up with primary care doctor and discussed strict return precautions for any change in symptoms concerning for stroke or central cause of vertigo.       Lab Tests: -I ordered, reviewed, and interpreted labs.   The pertinent results include:   Labs Reviewed  BASIC METABOLIC PANEL - Abnormal; Notable for the following components:      Result Value   Glucose, Bld 157 (*)    Creatinine, Ser 1.29 (*)    All other components within normal limits  CBC WITH DIFFERENTIAL/PLATELET      EKG   EKG Interpretation  Date/Time:    Ventricular Rate:    PR Interval:    QRS Duration:   QT Interval:    QTC Calculation:   R Axis:     Text Interpretation:           Imaging Studies ordered: I ordered imaging studies including CT brain I independently visualized and interpreted imaging. I  agree with the radiologist interpretation   Medicines ordered and prescription drug management: Meds ordered this encounter  Medications   meclizine (ANTIVERT) 25 MG tablet    Sig: Take 1 tablet (25 mg total) by mouth 3 (three) times daily as needed for dizziness.    Dispense:  30 tablet    Refill:  0    -I have reviewed the patients home medicines and have made adjustments as needed     Reevaluation: After the interventions noted above, I reevaluated the patient  and found that they have :improved  Co morbidities that complicate the patient evaluation  Past Medical History:  Diagnosis Date   Allergy    GERD (gastroesophageal reflux disease)    Hyperlipemia    Hypertension    Pneumonia       Dispostion: I considered admission for this patient, given reassuring workup and reassuring exam, will discharge with outpatient follow-up and strict return precautions     Final Clinical Impression(s) / ED Diagnoses Final diagnoses:  Vertigo     This chart was dictated using voice recognition software.  Despite best efforts to proofread,  errors can occur which can change the documentation meaning.    Lonell Grandchild, MD 03/21/22 [phone]

## 2022-03-21 NOTE — ED Triage Notes (Addendum)
Pt is c/o dizziness and feeling off balance  Pt states it started last night  Pt has no neuro deficits at this time   Speech is clear   Pt states the dizziness gets worse if he turns his head from side to side or when he raises his head

## 2022-03-21 NOTE — ED Notes (Signed)
Discharge instructions reviewed with patient. Patient verbalizes understanding, no further questions at this time. Medications/prescriptions and follow up information provided. No acute distress noted at time of departure.  

## 2022-03-21 NOTE — Discharge Instructions (Addendum)
We evaluated you today for your dizziness.  Your exam was normal and your head CT scan was also normal.  Your lab tests did not show any dangerous findings  Your symptoms are likely due to a condition called positional vertigo.  Please take the medicine I have prescribed if you experience severe symptoms.  At home, you can try to perform the Epley maneuver.  You can search this on YouTube to find a demonstration on how to do this at home.  Please return to the emergency department if you develop any persistent symptoms that do not go away, trouble speaking, facial droop, weakness or numbness in your arms or legs, inability to walk, or any other concerning symptoms.

## 2022-03-24 ENCOUNTER — Ambulatory Visit: Payer: BC Managed Care – PPO | Admitting: Family Medicine

## 2022-03-24 ENCOUNTER — Encounter: Payer: Self-pay | Admitting: Family Medicine

## 2022-03-24 VITALS — BP 128/80 | HR 90 | Temp 99.0°F | Wt 180.0 lb

## 2022-03-24 DIAGNOSIS — R42 Dizziness and giddiness: Secondary | ICD-10-CM | POA: Diagnosis not present

## 2022-03-24 NOTE — Progress Notes (Signed)
   Subjective:    Patient ID: Steven Cook, male    DOB: 08-Sep-1955, 66 y.o.   MRN: [id number]  HPI Here to follow up an ED visit on 03-21-22. He had the sudden onset of dizziness which he had never had before. He described a sensation of the room spinning when he moved his head quickly. No headache and no other neurologic symptoms. At the ED his labs and exam were normal. A CT scan of the head was normal. He was diagnosed with vertigo, and he was given Meclizine to try. Since then the dizziness has waxed and waned. He thinks the Meclizine helps a little.    Review of Systems  Respiratory: Negative.    Cardiovascular: Negative.   Neurological:  Positive for dizziness. Negative for tremors, seizures, syncope, facial asymmetry, speech difficulty, weakness, light-headedness, numbness and headaches.       Objective:   Physical Exam Constitutional:      General: He is not in acute distress.    Appearance: Normal appearance.  HENT:     Right Ear: Tympanic membrane, ear canal and external ear normal.     Left Ear: Tympanic membrane, ear canal and external ear normal.     Nose: Nose normal.     Mouth/Throat:     Pharynx: Oropharynx is clear.  Eyes:     Conjunctiva/sclera: Conjunctivae normal.     Pupils: Pupils are equal, round, and reactive to light.  Cardiovascular:     Rate and Rhythm: Normal rate and regular rhythm.     Pulses: Normal pulses.     Heart sounds: Normal heart sounds.  Pulmonary:     Effort: Pulmonary effort is normal.     Breath sounds: Normal breath sounds.  Neurological:     General: No focal deficit present.     Mental Status: He is alert and oriented to person, place, and time.     Cranial Nerves: No cranial nerve deficit.     Gait: Gait normal.           Assessment & Plan:  Vertigo. He will rest and drink plenty of fluids. Take Zyrtec D regularly for the next few weeks. Add Meclizine as needed. We spent a total of ( 30  ) minutes reviewing records  and discussing these issues.   Gershon Crane, MD

## 2022-04-02 NOTE — Addendum Note (Signed)
Addended by: Kathreen Devoid on: 04/02/2022 04:57 PM   Modules accepted: Orders

## 2022-04-11 ENCOUNTER — Encounter: Payer: Self-pay | Admitting: Family Medicine

## 2022-05-23 ENCOUNTER — Other Ambulatory Visit: Payer: Self-pay

## 2022-05-23 DIAGNOSIS — Z1211 Encounter for screening for malignant neoplasm of colon: Secondary | ICD-10-CM

## 2022-05-26 ENCOUNTER — Other Ambulatory Visit: Payer: Self-pay | Admitting: Family Medicine

## 2022-05-26 DIAGNOSIS — I10 Essential (primary) hypertension: Secondary | ICD-10-CM

## 2022-07-09 ENCOUNTER — Other Ambulatory Visit: Payer: Self-pay

## 2022-07-09 ENCOUNTER — Encounter: Payer: Self-pay | Admitting: Family Medicine

## 2022-07-09 DIAGNOSIS — Z1211 Encounter for screening for malignant neoplasm of colon: Secondary | ICD-10-CM

## 2022-08-27 ENCOUNTER — Other Ambulatory Visit: Payer: Self-pay | Admitting: Family Medicine

## 2022-09-22 NOTE — Progress Notes (Unsigned)
HPI:  [name] is a 67 y.o.male with PMHx significant for hypertension, hyperlipidemia, prediabetes, GERD, BPH, and hearing loss here today for his routine physical examination.  Last CPE: 09/16/21  The patient has recently lost weight due to a change in work habits, switching from day shifts to night shifts. He reports eating less and consuming homemade food, with a preference for fruits such as peaches, bananas, and oranges.  Sleeps about 7 hours. He is a former smoker, quit smoking in 2022. He started smoking at the age of 89 and smoked intermittently, with periods of cessation lasting from six months to a year. He smoked half a pack per day for many years, but sometimes only a pack a week. He reports no family history of lung cancer.  Immunization History  Administered Date(s) Administered   Influenza, Seasonal, Injecte, Preservative Fre 06/12/2015   Influenza,inj,Quad PF,6+ Mos 08/26/2016, 08/26/2017, 08/27/2018, 08/29/2019, 09/11/2020   PNEUMOCOCCAL CONJUGATE-20 09/23/2022   Pneumococcal Polysaccharide-23 08/26/2017   Tdap 09/04/2011, 09/04/2011   Zoster Recombinat (Shingrix) 08/27/2018, 03/11/2021   Health Maintenance  Topic Date Due   DTaP/Tdap/Td (3 - Td or Tdap) 09/03/2021   COVID-19 Vaccine (1) 10/09/2022 (Originally 03/04/1956)   INFLUENZA VACCINE  11/09/2022 (Originally 03/11/2022)   COLONOSCOPY (Pts 45-34yr Insurance coverage will need to be confirmed)  08/11/2026   Pneumonia Vaccine 65 Years old  Completed   Hepatitis C Screening  Completed   HIV Screening  Completed   Zoster Vaccines- Shingrix  Completed   HPV VACCINES  Aged Out   Last prostate ca screening: BPH, no changes in urinary frequency.  He tries not to drink fluids around 3 hours before going to bed.  Lab Results  Component Value Date   PSA 3.09 09/16/2021   PSA 3.28 09/11/2020   PSA 2.28 08/29/2019   He reports being congested but denies having SOB or wheezing.  Nasal congestion and  postnasal drainage since yesterday. No sick contact. He denies fever or feeling sick. Felt better after a hot shower today.  Since his last visit, he saw orthopedics for right knee pain.  According to patient, he was diagnosed with knee OA, he declined intra-articular injections and currently is using topical treatment, which has helped. Hypertension on lisinopril 20 mg daily. Lab Results  Component Value Date   CREATININE 1.29 (H) 03/21/2022   BUN 17 03/21/2022   NA 137 03/21/2022   K 4.0 03/21/2022   CL 105 03/21/2022   CO2 25 03/21/2022   HLD: He is on atorvastatin 40 mg daily. Lab Results  Component Value Date   CHOL 125 09/16/2021   HDL 39.30 09/16/2021   LDLCALC 64 09/16/2021   TRIG 107.0 09/16/2021   CHOLHDL 3 09/16/2021   Prediabetes: Lab Results  Component Value Date   HGBA1C 6.5 03/17/2022   GERD on omeprazole 20 mg daily.  Review of Systems  Constitutional:  Negative for activity change, appetite change and fever.  HENT:  Positive for postnasal drip and rhinorrhea. Negative for dental problem, nosebleeds, sore throat and trouble swallowing.   Eyes:  Negative for redness and visual disturbance.  Respiratory:  Negative for cough, shortness of breath and wheezing.   Cardiovascular:  Negative for chest pain, palpitations and leg swelling.  Gastrointestinal:  Negative for abdominal pain, blood in stool, nausea and vomiting.  Endocrine: Negative for cold intolerance, heat intolerance, polydipsia, polyphagia and polyuria.  Genitourinary:  Negative for decreased urine volume, dysuria, genital sores, hematuria and testicular pain.  Musculoskeletal:  Positive for arthralgias. Negative for gait problem and myalgias.  Skin:  Negative for color change and rash.  Allergic/Immunologic: Positive for environmental allergies.  Neurological:  Negative for dizziness, syncope, weakness, numbness and headaches.  Hematological:  Negative for adenopathy. Does not bruise/bleed easily.   Psychiatric/Behavioral:  Negative for confusion and sleep disturbance. The patient is not nervous/anxious.    Current Outpatient Medications on File Prior to Visit  Medication Sig Dispense Refill   aspirin EC 81 MG tablet Take 81 mg by mouth daily. Swallow whole.     atorvastatin (LIPITOR) 40 MG tablet TAKE 1 TABLET BY MOUTH EVERY DAY 90 tablet 3   lisinopril (ZESTRIL) 20 MG tablet TAKE 1 TABLET BY MOUTH EVERY DAY 90 tablet 2   omeprazole (PRILOSEC) 20 MG capsule TAKE 1 CAPSULE BY MOUTH EVERY DAY 90 capsule 3   No current facility-administered medications on file prior to visit.   Past Medical History:  Diagnosis Date   Allergy    GERD (gastroesophageal reflux disease)    Hyperlipemia    Hypertension    Pneumonia    Past Surgical History:  Procedure Laterality Date   BACK SURGERY     Allergies  Allergen Reactions   Nsaids Anaphylaxis   Family History  Problem Relation Age of Onset   Arthritis Mother    Hyperlipidemia Father    Hypertension Father    Diabetes Father    Diabetes Maternal Grandfather    Social History   Socioeconomic History   Marital status: Married    Spouse name: Not on file   Number of children: Not on file   Years of education: Not on file   Highest education level: 12th grade  Occupational History   Not on file  Tobacco Use   Smoking status: Former    Packs/day: 0.50    Types: Cigarettes    Start date: 09/04/1973    Quit date: 03/18/2021    Years since quitting: 1.5   Smokeless tobacco: Current   Tobacco comments:    40 years on and off.    For several years he smoked 1 pack per week.  Vaping Use   Vaping Use: Some days   Substances: Nicotine  Substance and Sexual Activity   Alcohol use: Yes    Comment: rare   Drug use: No   Sexual activity: Not on file  Other Topics Concern   Not on file  Social History Narrative   Not on file   Social Determinants of Health   Financial Resource Strain: Low Risk  (08/22/2021)   Overall  Financial Resource Strain (CARDIA)    Difficulty of Paying Living Expenses: Not hard at all  Food Insecurity: No Food Insecurity (08/22/2021)   Hunger Vital Sign    Worried About Running Out of Food in the Last Year: Never true    Ran Out of Food in the Last Year: Never true  Transportation Needs: No Transportation Needs (08/22/2021)   PRAPARE - Hydrologist (Medical): No    Lack of Transportation (Non-Medical): No  Physical Activity: Unknown (08/22/2021)   Exercise Vital Sign    Days of Exercise per Week: 7 days    Minutes of Exercise per Session: Patient refused  Stress: No Stress Concern Present (08/22/2021)   [place]    Feeling of Stress : Not at all  Social Connections: Unknown (08/22/2021)   Social Connection and Isolation Panel [NHANES]  Frequency of Communication with Friends and Family: Three times a week    Frequency of Social Gatherings with Friends and Family: Patient refused    Attends Religious Services: Patient refused    Active Member of Clubs or Organizations: No    Attends Music therapist: Not on file    Marital Status: Married   Vitals:   09/23/22 0700  BP: 120/78  Pulse: 64  Resp: 16  Temp: 98.5 F (36.9 C)  SpO2: 98%  Body mass index is 25.97 kg/m. Wt Readings from Last 3 Encounters:  09/23/22 181 lb (82.1 kg)  03/24/22 180 lb (81.6 kg)  03/21/22 185 lb (83.9 kg)  Physical Exam Vitals and nursing note reviewed.  Constitutional:      General: He is not in acute distress.    Appearance: He is well-developed.  HENT:     Head: Normocephalic and atraumatic.     Right Ear: Tympanic membrane, ear canal and external ear normal.     Left Ear: Tympanic membrane, ear canal and external ear normal.     Ears:     Comments: Hearing aids.    Nose: Rhinorrhea present.     Mouth/Throat:     Mouth: Mucous membranes are moist.     Pharynx: Oropharynx is  clear.  Eyes:     Extraocular Movements: Extraocular movements intact.     Conjunctiva/sclera: Conjunctivae normal.     Pupils: Pupils are equal, round, and reactive to light.  Neck:     Thyroid: No thyroid mass or thyromegaly.  Cardiovascular:     Rate and Rhythm: Normal rate and regular rhythm.     Pulses:          Dorsalis pedis pulses are 2+ on the right side and 2+ on the left side.     Heart sounds: No murmur heard. Pulmonary:     Effort: Pulmonary effort is normal. No respiratory distress.     Breath sounds: Normal breath sounds.  Abdominal:     Palpations: Abdomen is soft. There is no hepatomegaly or mass.     Tenderness: There is no abdominal tenderness.  Genitourinary:    Comments: No concerns. Musculoskeletal:        General: No tenderness.     Cervical back: Normal range of motion.     Comments: No signs of synovitis.  Lymphadenopathy:     Cervical: No cervical adenopathy.     Upper Body:     Right upper body: No supraclavicular adenopathy.     Left upper body: No supraclavicular adenopathy.  Skin:    General: Skin is warm.     Findings: No erythema.  Neurological:     General: No focal deficit present.     Mental Status: He is alert and oriented to person, place, and time.     Cranial Nerves: No cranial nerve deficit.     Sensory: No sensory deficit.     Gait: Gait normal.     Deep Tendon Reflexes:     Reflex Scores:      Bicep reflexes are 2+ on the right side and 2+ on the left side.      Patellar reflexes are 2+ on the right side and 2+ on the left side. Psychiatric:        Mood and Affect: Mood and affect normal.   ASSESSMENT AND PLAN:  Mr. Cason was seen today for annual exam.  Diagnoses and all orders for this visit:  Routine general medical  examination at a health care facility Assessment & Plan: We discussed the importance of regular physical activity and healthy diet for prevention of chronic illness and/or complications. Preventive  guidelines reviewed. Vaccination updated, Prevnar 20 given today. He is having colonoscopy next week. AAA screening will be arranged. We discussed current recommendations for lung cancer screening. Next CPE in a year.   Essential hypertension Assessment & Plan: BP adequately controlled. Continue lisinopril 20 mg daily and low-salt diet.  Orders: -     Comprehensive metabolic panel; Future  Mixed hyperlipidemia Assessment & Plan: Continue atorvastatin 40 mg daily and low-fat diet. Further recommendation will be given according to lipid panel result.  Orders: -     Lipid panel; Future -     Comprehensive metabolic panel; Future  Benign prostatic hyperplasia without lower urinary tract symptoms Assessment & Plan: Symptoms otherwise stable. We discussed current recommendation for prostate cancer screening. PSA ordered today.  Orders: -     PSA; Future  Prediabetes Assessment & Plan: Encouraged a healthy lifestyle for diabetes prevention. Further recommendation will be given according to hemoglobin A1c result.  Orders: -     Hemoglobin A1c; Future  Screening for AAA (aortic abdominal aneurysm) -     US AORTA; Future  Need for pneumococcal vaccination -     Pneumococcal conjugate vaccine 20-valent  In regard to nasal congestion or rhinorrhea, no other associated symptoms.  Could be seasonal allergy related. Recommend nasal saline irrigations as needed. Monitor for new symptoms.  Return in 6 months (on 03/24/2023) for chronic problems.  Willies Laviolette G. [place], MD  [facility]. [place] office.

## 2022-09-23 ENCOUNTER — Ambulatory Visit (INDEPENDENT_AMBULATORY_CARE_PROVIDER_SITE_OTHER): Payer: BC Managed Care – PPO | Admitting: Family Medicine

## 2022-09-23 ENCOUNTER — Encounter: Payer: Self-pay | Admitting: Family Medicine

## 2022-09-23 VITALS — BP 120/78 | HR 64 | Temp 98.5°F | Resp 16 | Ht 70.0 in | Wt 181.0 lb

## 2022-09-23 DIAGNOSIS — E782 Mixed hyperlipidemia: Secondary | ICD-10-CM

## 2022-09-23 DIAGNOSIS — R7303 Prediabetes: Secondary | ICD-10-CM | POA: Diagnosis not present

## 2022-09-23 DIAGNOSIS — Z136 Encounter for screening for cardiovascular disorders: Secondary | ICD-10-CM

## 2022-09-23 DIAGNOSIS — I10 Essential (primary) hypertension: Secondary | ICD-10-CM | POA: Diagnosis not present

## 2022-09-23 DIAGNOSIS — Z0000 Encounter for general adult medical examination without abnormal findings: Secondary | ICD-10-CM | POA: Diagnosis not present

## 2022-09-23 DIAGNOSIS — Z23 Encounter for immunization: Secondary | ICD-10-CM | POA: Diagnosis not present

## 2022-09-23 DIAGNOSIS — N400 Enlarged prostate without lower urinary tract symptoms: Secondary | ICD-10-CM | POA: Diagnosis not present

## 2022-09-23 LAB — COMPREHENSIVE METABOLIC PANEL
ALT: 24 U/L (ref 0–53)
AST: 15 U/L (ref 0–37)
Albumin: 4.2 g/dL (ref 3.5–5.2)
Alkaline Phosphatase: 79 U/L (ref 39–117)
BUN: 18 mg/dL (ref 6–23)
CO2: 24 mEq/L (ref 19–32)
Calcium: 9.9 mg/dL (ref 8.4–10.5)
Chloride: 103 mEq/L (ref 96–112)
Creatinine, Ser: 1.27 mg/dL (ref 0.40–1.50)
GFR: 59.48 mL/min — ABNORMAL LOW (ref >60.00)
Glucose, Bld: 95 mg/dL (ref 70–99)
Potassium: 4.5 mEq/L (ref 3.5–5.1)
Sodium: 139 mEq/L (ref 135–145)
Total Bilirubin: 0.7 mg/dL (ref 0.2–1.2)
Total Protein: 6.4 g/dL (ref 6.0–8.3)

## 2022-09-23 LAB — LIPID PANEL
Cholesterol: 136 mg/dL (ref 0–200)
HDL: 38.9 mg/dL — ABNORMAL LOW (ref >39.00)
LDL Cholesterol: 66 mg/dL (ref 0–99)
NonHDL: 97.01
Total CHOL/HDL Ratio: 3
Triglycerides: 156 mg/dL — ABNORMAL HIGH (ref 0.0–149.0)
VLDL: 31.2 mg/dL (ref 0.0–40.0)

## 2022-09-23 LAB — PSA: PSA: 3.11 ng/mL (ref 0.10–4.00)

## 2022-09-23 LAB — HEMOGLOBIN A1C: Hgb A1c MFr Bld: 6.2 % (ref 4.6–6.5)

## 2022-09-23 MED ORDER — LISINOPRIL 20 MG PO TABS: 20.0000 mg | ORAL_TABLET | Freq: Every day | ORAL | 2 refills | Status: DC

## 2022-09-23 NOTE — Assessment & Plan Note (Signed)
Continue atorvastatin 40 mg daily and low-fat diet. Further recommendation will be given according to lipid panel result.

## 2022-09-23 NOTE — Assessment & Plan Note (Signed)
Symptoms otherwise stable. We discussed current recommendation for prostate cancer screening. PSA ordered today.

## 2022-09-23 NOTE — Assessment & Plan Note (Signed)
BP adequately controlled. Continue lisinopril 20 mg daily and low-salt diet.

## 2022-09-23 NOTE — Patient Instructions (Addendum)
A few things to remember from today's visit:  Routine general medical examination at a health care facility  Essential hypertension - Plan: Comprehensive metabolic panel  Mixed hyperlipidemia - Plan: Lipid Panel, Comprehensive metabolic panel  Benign prostatic hyperplasia without lower urinary tract symptoms - Plan: PSA  Prediabetes - Plan: Hemoglobin A1c  Screening for AAA (aortic abdominal aneurysm) - Plan: US Aorta  If you need refills for medications you take chronically, please call your pharmacy. Do not use My Chart to request refills or for acute issues that need immediate attention. If you send a my chart message, it may take a few days to be addressed, specially if I am not in the office.  Please be sure medication list is accurate. If a new problem present, please set up appointment sooner than planned today.  Health Maintenance, Male Adopting a healthy lifestyle and getting preventive care are important in promoting health and wellness. Ask your health care provider about: The right schedule for you to have regular tests and exams. Things you can do on your own to prevent diseases and keep yourself healthy. What should I know about diet, weight, and exercise? Eat a healthy diet  Eat a diet that includes plenty of vegetables, fruits, low-fat dairy products, and lean protein. Do not eat a lot of foods that are high in solid fats, added sugars, or sodium. Maintain a healthy weight Body mass index (BMI) is a measurement that can be used to identify possible weight problems. It estimates body fat based on height and weight. Your health care provider can help determine your BMI and help you achieve or maintain a healthy weight. Get regular exercise Get regular exercise. This is one of the most important things you can do for your health. Most adults should: Exercise for at least 150 minutes each week. The exercise should increase your heart rate and make you sweat  (moderate-intensity exercise). Do strengthening exercises at least twice a week. This is in addition to the moderate-intensity exercise. Spend less time sitting. Even light physical activity can be beneficial. Watch cholesterol and blood lipids Have your blood tested for lipids and cholesterol at 65 years of age, then have this test every 5 years. You may need to have your cholesterol levels checked more often if: Your lipid or cholesterol levels are high. You are older than 65 years of age. You are at high risk for heart disease. What should I know about cancer screening? Many types of cancers can be detected early and may often be prevented. Depending on your health history and family history, you may need to have cancer screening at various ages. This may include screening for: Colorectal cancer. Prostate cancer. Skin cancer. Lung cancer. What should I know about heart disease, diabetes, and high blood pressure? Blood pressure and heart disease High blood pressure causes heart disease and increases the risk of stroke. This is more likely to develop in people who have high blood pressure readings or are overweight. Talk with your health care provider about your target blood pressure readings. Have your blood pressure checked: Every 3-5 years if you are 12-54 years of age. Every year if you are 9 years old or older. If you are between the ages of 22 and 4 and are a current or former smoker, ask your health care provider if you should have a one-time screening for abdominal aortic aneurysm (AAA). Diabetes Have regular diabetes screenings. This checks your fasting blood sugar level. Have the screening done: Once  every three years after age 91 if you are at a normal weight and have a low risk for diabetes. More often and at a younger age if you are overweight or have a high risk for diabetes. What should I know about preventing infection? Hepatitis B If you have a higher risk for  hepatitis B, you should be screened for this virus. Talk with your health care provider to find out if you are at risk for hepatitis B infection. Hepatitis C Blood testing is recommended for: Everyone born from [birth year] through 1963. Anyone with known risk factors for hepatitis C. Sexually transmitted infections (STIs) You should be screened each year for STIs, including gonorrhea and chlamydia, if: You are sexually active and are younger than 65 years of age. You are older than 65 years of age and your health care provider tells you that you are at risk for this type of infection. Your sexual activity has changed since you were last screened, and you are at increased risk for chlamydia or gonorrhea. Ask your health care provider if you are at risk. Ask your health care provider about whether you are at high risk for HIV. Your health care provider may recommend a prescription medicine to help prevent HIV infection. If you choose to take medicine to prevent HIV, you should first get tested for HIV. You should then be tested every 3 months for as long as you are taking the medicine. Follow these instructions at home: Alcohol use Do not drink alcohol if your health care provider tells you not to drink. If you drink alcohol: Limit how much you have to 0-2 drinks a day. Know how much alcohol is in your drink. In the [place] one drink equals one 12 oz bottle of beer (355 mL), one 5 oz glass of wine (148 mL), or one 1 oz glass of hard liquor (44 mL). Lifestyle Do not use any products that contain nicotine or tobacco. These products include cigarettes, chewing tobacco, and vaping devices, such as e-cigarettes. If you need help quitting, ask your health care provider. Do not use street drugs. Do not share needles. Ask your health care provider for help if you need support or information about quitting drugs. General instructions Schedule regular health, dental, and eye exams. Stay current with your  vaccines. Tell your health care provider if: You often feel depressed. You have ever been abused or do not feel safe at home. Summary Adopting a healthy lifestyle and getting preventive care are important in promoting health and wellness. Follow your health care provider's instructions about healthy diet, exercising, and getting tested or screened for diseases. Follow your health care provider's instructions on monitoring your cholesterol and blood pressure. This information is not intended to replace advice given to you by your health care provider. Make sure you discuss any questions you have with your health care provider. Document Revised: 12/17/2020 Document Reviewed: 12/17/2020 Elsevier Patient Education  [place].

## 2022-09-23 NOTE — Assessment & Plan Note (Signed)
Encouraged a healthy lifestyle for diabetes prevention. Further recommendation will be given according to hemoglobin A1c result.

## 2022-09-23 NOTE — Assessment & Plan Note (Signed)
We discussed the importance of regular physical activity and healthy diet for prevention of chronic illness and/or complications. Preventive guidelines reviewed. Vaccination updated, Prevnar 20 given today. He is having colonoscopy next week. AAA screening will be arranged. We discussed current recommendations for lung cancer screening. Next CPE in a year.

## 2022-09-29 DIAGNOSIS — K648 Other hemorrhoids: Secondary | ICD-10-CM | POA: Diagnosis not present

## 2022-09-29 DIAGNOSIS — K5730 Diverticulosis of large intestine without perforation or abscess without bleeding: Secondary | ICD-10-CM | POA: Diagnosis not present

## 2022-09-29 DIAGNOSIS — Z1211 Encounter for screening for malignant neoplasm of colon: Secondary | ICD-10-CM | POA: Diagnosis not present

## 2022-09-30 ENCOUNTER — Ambulatory Visit (HOSPITAL_BASED_OUTPATIENT_CLINIC_OR_DEPARTMENT_OTHER)
Admission: RE | Admit: 2022-09-30 | Discharge: 2022-09-30 | Disposition: A | Discharge status: Home / Self Care | Payer: BC Managed Care – PPO | Source: Ambulatory Visit | Attending: Family Medicine | Admitting: Family Medicine

## 2022-09-30 ENCOUNTER — Encounter (HOSPITAL_BASED_OUTPATIENT_CLINIC_OR_DEPARTMENT_OTHER): Payer: Self-pay

## 2022-09-30 DIAGNOSIS — Z136 Encounter for screening for cardiovascular disorders: Secondary | ICD-10-CM | POA: Insufficient documentation

## 2022-10-03 ENCOUNTER — Encounter: Payer: Self-pay | Admitting: Family Medicine

## 2022-10-23 DIAGNOSIS — M25461 Effusion, right knee: Secondary | ICD-10-CM | POA: Diagnosis not present

## 2022-10-23 DIAGNOSIS — M25561 Pain in right knee: Secondary | ICD-10-CM | POA: Diagnosis not present

## 2022-10-23 DIAGNOSIS — G8929 Other chronic pain: Secondary | ICD-10-CM | POA: Diagnosis not present

## 2022-11-11 DIAGNOSIS — M25461 Effusion, right knee: Secondary | ICD-10-CM | POA: Diagnosis not present

## 2022-11-11 DIAGNOSIS — M1711 Unilateral primary osteoarthritis, right knee: Secondary | ICD-10-CM | POA: Diagnosis not present

## 2022-11-11 DIAGNOSIS — G8929 Other chronic pain: Secondary | ICD-10-CM | POA: Diagnosis not present

## 2022-11-11 DIAGNOSIS — M25561 Pain in right knee: Secondary | ICD-10-CM | POA: Diagnosis not present

## 2022-11-17 DIAGNOSIS — S83241A Other tear of medial meniscus, current injury, right knee, initial encounter: Secondary | ICD-10-CM | POA: Diagnosis not present

## 2022-11-18 ENCOUNTER — Other Ambulatory Visit: Payer: Self-pay | Admitting: Family Medicine

## 2022-11-18 DIAGNOSIS — K219 Gastro-esophageal reflux disease without esophagitis: Secondary | ICD-10-CM

## 2022-12-10 DIAGNOSIS — X58XXXA Exposure to other specified factors, initial encounter: Secondary | ICD-10-CM | POA: Diagnosis not present

## 2022-12-10 DIAGNOSIS — M6751 Plica syndrome, right knee: Secondary | ICD-10-CM | POA: Diagnosis not present

## 2022-12-10 DIAGNOSIS — S83221A Peripheral tear of medial meniscus, current injury, right knee, initial encounter: Secondary | ICD-10-CM | POA: Diagnosis not present

## 2022-12-10 DIAGNOSIS — S83231A Complex tear of medial meniscus, current injury, right knee, initial encounter: Secondary | ICD-10-CM | POA: Diagnosis not present

## 2022-12-10 DIAGNOSIS — M94261 Chondromalacia, right knee: Secondary | ICD-10-CM | POA: Diagnosis not present

## 2022-12-10 DIAGNOSIS — Y999 Unspecified external cause status: Secondary | ICD-10-CM | POA: Diagnosis not present

## 2022-12-10 DIAGNOSIS — M659 Synovitis and tenosynovitis, unspecified: Secondary | ICD-10-CM | POA: Diagnosis not present

## 2022-12-10 DIAGNOSIS — G8918 Other acute postprocedural pain: Secondary | ICD-10-CM | POA: Diagnosis not present

## 2022-12-16 DIAGNOSIS — Z9889 Other specified postprocedural states: Secondary | ICD-10-CM | POA: Diagnosis not present

## 2022-12-16 DIAGNOSIS — M25661 Stiffness of right knee, not elsewhere classified: Secondary | ICD-10-CM | POA: Diagnosis not present

## 2023-03-10 DIAGNOSIS — M660 Rupture of popliteal cyst: Secondary | ICD-10-CM | POA: Diagnosis not present

## 2023-03-10 DIAGNOSIS — Z9889 Other specified postprocedural states: Secondary | ICD-10-CM | POA: Diagnosis not present

## 2023-03-10 DIAGNOSIS — M79661 Pain in right lower leg: Secondary | ICD-10-CM | POA: Diagnosis not present

## 2023-03-10 DIAGNOSIS — R2241 Localized swelling, mass and lump, right lower limb: Secondary | ICD-10-CM | POA: Diagnosis not present

## 2023-03-18 NOTE — Progress Notes (Signed)
HPI: [name] is a 67 y.o. male, who is here today for chronic disease management.  Last seen on 09/23/22 Since his last visit he underwent right knee surgery. He reports history of Baker's cyst that burst 2 weeks ago, evaluated by her orthopedist, states that [place] was ordered, still waiting for appt information.  Swelling from knee down has been gradually improving but still has "irritation" after prolonged walking at work for a couple of days, pain improves with rest. He has not noted erythema. Denies chest pain, shortness of breath or palpitations.  Had colonoscopy at [facility] in 09/2022, and was advised high fiber diet.    He also has recurrent rash on buttocks, similar to previous rash on penis that was treated with clotrimazole. Rash on buttocks has improved with same medication but still has pruritic and scaling.  Reports progressive hearing loss in right ear over last 5 years and worsening tinnitus.  New hearing aids were recommended at the [facility] but finds them uncomfortable, has appt today with ENT.  He quit smoking on 03/18/2021.  Negative for cough or wheezing  Hypertension on lisinopril 20 mg daily.  Denies severe/frequent headache, visual changes, chest pain, dyspnea, palpitation, focal weakness, or edema.  Lab Results  Component Value Date   NA 139 09/23/2022   K 4.5 09/23/2022   CO2 24 09/23/2022   GLUCOSE 95 09/23/2022   BUN 18 09/23/2022   CREATININE 1.27 09/23/2022   CALCIUM 9.9 09/23/2022   GFR 59.48 (L) 09/23/2022   GFRNONAA >60 03/21/2022   Review of Systems  Constitutional:  Negative for activity change, appetite change and fever.  HENT:  Negative for ear discharge, ear pain, nosebleeds, sore throat and trouble swallowing.   Gastrointestinal:  Negative for abdominal pain, nausea and vomiting.  Endocrine: Negative for cold intolerance and heat intolerance.  Genitourinary:  Negative for decreased urine volume and hematuria.  Skin:  Negative for  rash.  Neurological:  Negative for syncope and facial asymmetry.  See other pertinent positives and negatives in HPI.  Current Outpatient Medications on File Prior to Visit  Medication Sig Dispense Refill   aspirin EC 81 MG tablet Take 81 mg by mouth daily. Swallow whole.     atorvastatin (LIPITOR) 40 MG tablet TAKE 1 TABLET BY MOUTH EVERY DAY 90 tablet 3   lisinopril (ZESTRIL) 20 MG tablet Take 1 tablet (20 mg total) by mouth daily. 90 tablet 2   omeprazole (PRILOSEC) 20 MG capsule TAKE 1 CAPSULE BY MOUTH EVERY DAY 90 capsule 3   OVER THE COUNTER MEDICATION Pt states he is taking allergy med every day. Unable to recall name.     No current facility-administered medications on file prior to visit.   Past Medical History:  Diagnosis Date   Allergy    GERD (gastroesophageal reflux disease)    Hyperlipemia    Hypertension    Pneumonia    Allergies  Allergen Reactions   Nsaids Anaphylaxis   Social History   Socioeconomic History   Marital status: Married    Spouse name: Not on file   Number of children: Not on file   Years of education: Not on file   Highest education level: 12th grade  Occupational History   Not on file  Tobacco Use   Smoking status: Former    Current packs/day: 0.00    Average packs/day: 0.5 packs/day for 47.5 years (23.8 ttl pk-yrs)    Types: Cigarettes    Start date: 09/04/1973  Quit date: 03/18/2021    Years since quitting: 2.0   Smokeless tobacco: Current   Tobacco comments:    40 years on and off.    For several years he smoked 1 pack per week.  Vaping Use   Vaping status: Some Days   Substances: Nicotine  Substance and Sexual Activity   Alcohol use: Yes    Comment: rare   Drug use: No   Sexual activity: Not on file  Other Topics Concern   Not on file  Social History Narrative   Not on file   Social Determinants of Health   Financial Resource Strain: Patient Declined (03/19/2023)   Overall Financial Resource Strain (CARDIA)     Difficulty of Paying Living Expenses: Patient declined  Food Insecurity: Patient Declined (03/19/2023)   Hunger Vital Sign    Worried About Running Out of Food in the Last Year: Patient declined    Ran Out of Food in the Last Year: Patient declined  Transportation Needs: Patient Declined (03/19/2023)   PRAPARE - Administrator, Civil Service (Medical): Patient declined    Lack of Transportation (Non-Medical): Patient declined  Physical Activity: Unknown (03/19/2023)   Exercise Vital Sign    Days of Exercise per Week: 7 days    Minutes of Exercise per Session: Not on file  Stress: No Stress Concern Present (03/19/2023)   [employer] of Occupational Health - Occupational Stress Questionnaire    Feeling of Stress : Not at all  Social Connections: Unknown (03/19/2023)   Social Connection and Isolation Panel [NHANES]    Frequency of Communication with Friends and Family: Patient declined    Frequency of Social Gatherings with Friends and Family: Patient declined    Attends Religious Services: Patient declined    Active Member of Clubs or Organizations: Not on file    Attends Banker Meetings: Not on file    Marital Status: Married    Vitals:   03/20/23 0704 03/20/23 0708  BP: (!) 150/82 (!) 140/80  Pulse: 71   Resp: 16   Temp: 98 F (36.7 C)   SpO2: 95%    Body mass index is 26.14 kg/m.  Physical Exam Vitals and nursing note reviewed.  Constitutional:      General: He is not in acute distress.    Appearance: He is well-developed.  HENT:     Head: Normocephalic and atraumatic.     Mouth/Throat:     Mouth: Mucous membranes are moist.  Eyes:     Conjunctiva/sclera: Conjunctivae normal.  Cardiovascular:     Rate and Rhythm: Normal rate and regular rhythm.     Pulses:          Dorsalis pedis pulses are 2+ on the right side and 2+ on the left side.     Heart sounds: No murmur heard. Pulmonary:     Effort: Pulmonary effort is normal. No respiratory  distress.     Breath sounds: Normal breath sounds.  Abdominal:     Palpations: Abdomen is soft. There is no hepatomegaly or mass.     Tenderness: There is no abdominal tenderness.  Skin:    General: Skin is warm.     Findings: Erythema and rash present. No abrasion. Rash is macular.       Neurological:     Mental Status: He is alert and oriented to person, place, and time.     Cranial Nerves: No cranial nerve deficit.     Gait: Gait normal.  Psychiatric:        Mood and Affect: Mood and affect normal.   ASSESSMENT AND PLAN:  Mr. Brindon was seen today for medical management of chronic issues and rash.  Diagnoses and all orders for this visit: Lab Results  Component Value Date   NA 137 03/20/2023   CL 101 03/20/2023   K 4.3 03/20/2023   CO2 30 03/20/2023   BUN 20 03/20/2023   CREATININE 1.31 03/20/2023   GFR 57.11 (L) 03/20/2023   CALCIUM 9.8 03/20/2023   ALBUMIN 4.2 09/23/2022   GLUCOSE 98 03/20/2023   Intertrigo Assessment & Plan: We discussed diagnosis, prognosis, and treatment options. Try to keep area dry. Lotrisone cream, small amount, twice daily for 14 days then as needed.  Orders: -     Clotrimazole-Betamethasone; Apply 1 Application topically 2 (two) times daily as needed.  Dispense: 30 g; Refill: 2  Essential hypertension Assessment & Plan: BP mildly elevated today. We discussed possible complications of elevated BP. Recommend monitoring BP at home for the next 2 weeks and to let me know about BP readings. For now continue lisinopril 20 mg daily and low-salt diet. He is due for eye exam. Follow-up in 6 months, before if needed.  Orders: -     Basic metabolic panel; Future  Screening for lung cancer -     Ambulatory Referral for Lung Cancer Scre  Sensorineural hearing loss (SNHL), unspecified laterality Assessment & Plan: He is wearing hearing aids. Reports today that right side is getting worse, he has an appointment at the [facility] today with  ENT.  Baker's cyst, ruptured He reports an improvement. Following with orthopedics, pending right LE [place].  Return in about 27 weeks (around 09/25/2023) for CPE.  Catherine Cubero G. [place], MD  [facility]. [place] office.

## 2023-03-20 ENCOUNTER — Ambulatory Visit: Payer: BC Managed Care – PPO | Admitting: Family Medicine

## 2023-03-20 ENCOUNTER — Other Ambulatory Visit: Payer: Self-pay | Admitting: Orthopedic Surgery

## 2023-03-20 ENCOUNTER — Encounter: Payer: Self-pay | Admitting: Family Medicine

## 2023-03-20 VITALS — BP 140/80 | HR 71 | Temp 98.0°F | Resp 16 | Ht 70.0 in | Wt 182.2 lb

## 2023-03-20 DIAGNOSIS — L304 Erythema intertrigo: Secondary | ICD-10-CM | POA: Insufficient documentation

## 2023-03-20 DIAGNOSIS — M660 Rupture of popliteal cyst: Secondary | ICD-10-CM

## 2023-03-20 DIAGNOSIS — I10 Essential (primary) hypertension: Secondary | ICD-10-CM | POA: Diagnosis not present

## 2023-03-20 DIAGNOSIS — H905 Unspecified sensorineural hearing loss: Secondary | ICD-10-CM

## 2023-03-20 DIAGNOSIS — Z122 Encounter for screening for malignant neoplasm of respiratory organs: Secondary | ICD-10-CM

## 2023-03-20 DIAGNOSIS — E782 Mixed hyperlipidemia: Secondary | ICD-10-CM

## 2023-03-20 DIAGNOSIS — Z9889 Other specified postprocedural states: Secondary | ICD-10-CM

## 2023-03-20 LAB — BASIC METABOLIC PANEL
BUN: 20 mg/dL (ref 6–23)
CO2: 30 mEq/L (ref 19–32)
Calcium: 9.8 mg/dL (ref 8.4–10.5)
Chloride: 101 mEq/L (ref 96–112)
Creatinine, Ser: 1.31 mg/dL (ref 0.40–1.50)
GFR: 57.11 mL/min — ABNORMAL LOW (ref >60.00)
Glucose, Bld: 98 mg/dL (ref 70–99)
Potassium: 4.3 mEq/L (ref 3.5–5.1)
Sodium: 137 mEq/L (ref 135–145)

## 2023-03-20 MED ORDER — CLOTRIMAZOLE-BETAMETHASONE 1-0.05 % EX CREA: 1.0000 | TOPICAL_CREAM | Freq: Two times a day (BID) | CUTANEOUS | 2 refills | Status: AC | PRN

## 2023-03-20 NOTE — Assessment & Plan Note (Signed)
BP mildly elevated today. We discussed possible complications of elevated BP. Recommend monitoring BP at home for the next 2 weeks and to let me know about BP readings. For now continue lisinopril 20 mg daily and low-salt diet. He is due for eye exam. Follow-up in 6 months, before if needed.

## 2023-03-20 NOTE — Assessment & Plan Note (Signed)
We discussed diagnosis, prognosis, and treatment options. Try to keep area dry. Lotrisone cream, small amount, twice daily for 14 days then as needed.

## 2023-03-20 NOTE — Patient Instructions (Addendum)
A few things to remember from today's visit:  Essential hypertension  Mixed hyperlipidemia Try Lotrimin powder to help keep area dry. Small amount of cream on area for 2 weeks then as needed. Check blood pressure 3 times in the morning before breakfast and 2 times at night before dinner, please send readings in 2 weeks. Goal is at least under 140/90.  If you need refills for medications you take chronically, please call your pharmacy. Do not use My Chart to request refills or for acute issues that need immediate attention. If you send a my chart message, it may take a few days to be addressed, specially if I am not in the office.  Please be sure medication list is accurate. If a new problem present, please set up appointment sooner than planned today.

## 2023-03-20 NOTE — Assessment & Plan Note (Signed)
He is wearing hearing aids. Reports today that right side is getting worse, he has an appointment at the [facility] today with ENT.

## 2023-03-23 ENCOUNTER — Ambulatory Visit
Admission: RE | Admit: 2023-03-23 | Discharge: 2023-03-23 | Disposition: A | Discharge status: Home / Self Care | Payer: BC Managed Care – PPO | Source: Ambulatory Visit | Attending: Orthopedic Surgery | Admitting: Orthopedic Surgery

## 2023-03-23 DIAGNOSIS — Z9889 Other specified postprocedural states: Secondary | ICD-10-CM

## 2023-03-23 DIAGNOSIS — M79604 Pain in right leg: Secondary | ICD-10-CM | POA: Diagnosis not present

## 2023-04-03 ENCOUNTER — Encounter: Payer: Self-pay | Admitting: Family Medicine

## 2023-04-20 ENCOUNTER — Encounter: Payer: Self-pay | Admitting: Family Medicine

## 2023-04-21 ENCOUNTER — Other Ambulatory Visit: Payer: Self-pay

## 2023-04-21 DIAGNOSIS — Z87891 Personal history of nicotine dependence: Secondary | ICD-10-CM

## 2023-04-21 DIAGNOSIS — Z122 Encounter for screening for malignant neoplasm of respiratory organs: Secondary | ICD-10-CM

## 2023-05-19 ENCOUNTER — Encounter: Payer: Self-pay | Admitting: Acute Care

## 2023-05-19 ENCOUNTER — Ambulatory Visit: Payer: BC Managed Care – PPO | Admitting: Acute Care

## 2023-05-19 DIAGNOSIS — Z87891 Personal history of nicotine dependence: Secondary | ICD-10-CM

## 2023-05-19 DIAGNOSIS — Z122 Encounter for screening for malignant neoplasm of respiratory organs: Secondary | ICD-10-CM | POA: Diagnosis not present

## 2023-05-19 NOTE — Progress Notes (Signed)
Virtual Visit via Telephone Note  I connected with Darolyn Rua on 05/19/23 at  2:00 PM EDT by telephone and verified that I am speaking with the correct person using two identifiers.  Location: Patient:  At home Provider:  [address] [place], [place], [address]    I discussed the limitations, risks, security and privacy concerns of performing an evaluation and management service by telephone and the availability of in person appointments. I also discussed with the patient that there may be a patient responsible charge related to this service. The patient expressed understanding and agreed to proceed.   Shared Decision Making Visit Lung Cancer Screening Program [phone])   Eligibility: Age 27 y.o. Pack Years Smoking History Calculation 33 pack year smoking history (# packs/per year x # years smoked) Recent History of coughing up blood  no Unexplained weight loss? no ( >Than 15 pounds within the last 6 months ) Prior History Lung / other cancer no (Diagnosis within the last 5 years already requiring surveillance chest CT Scans). Smoking Status Former Smoker Former Smokers: Years since quit: 2 years  Quit Date:  03/2021  Visit Components: Discussion included one or more decision making aids. yes Discussion included risk/benefits of screening. yes Discussion included potential follow up diagnostic testing for abnormal scans. yes Discussion included meaning and risk of over diagnosis. yes Discussion included meaning and risk of False Positives. yes Discussion included meaning of total radiation exposure. yes  Counseling Included: Importance of adherence to annual lung cancer LDCT screening. yes Impact of comorbidities on ability to participate in the program. yes Ability and willingness to under diagnostic treatment. yes  Smoking Cessation Counseling: Current Smokers:  Discussed importance of smoking cessation. yes Information about tobacco cessation classes and  interventions provided to patient. yes Patient provided with "ticket" for LDCT Scan. yes Symptomatic Patient. no  Counseling NA Diagnosis Code: Tobacco Use Z72.0 Asymptomatic Patient yes  Counseling (Intermediate counseling: > three minutes counseling) [id number] Former Smokers:  Discussed the importance of maintaining cigarette abstinence. yes Diagnosis Code: Personal History of Nicotine Dependence. E95.284 Information about tobacco cessation classes and interventions provided to patient. Yes Patient provided with "ticket" for LDCT Scan. yes Written Order for Lung Cancer Screening with LDCT placed in Epic. Yes (CT Chest Lung Cancer Screening Low Dose W/O CM) [id number] Z12.2-Screening of respiratory organs Z87.891-Personal history of nicotine dependence   Steven Ngo, NP 05/19/2023

## 2023-05-19 NOTE — Patient Instructions (Signed)

## 2023-05-20 ENCOUNTER — Ambulatory Visit (HOSPITAL_BASED_OUTPATIENT_CLINIC_OR_DEPARTMENT_OTHER)
Admission: RE | Admit: 2023-05-20 | Discharge: 2023-05-20 | Disposition: A | Discharge status: Home / Self Care | Payer: BC Managed Care – PPO | Source: Ambulatory Visit | Attending: Acute Care | Admitting: Acute Care

## 2023-05-20 DIAGNOSIS — I700 Atherosclerosis of aorta: Secondary | ICD-10-CM | POA: Diagnosis not present

## 2023-05-20 DIAGNOSIS — Z87891 Personal history of nicotine dependence: Secondary | ICD-10-CM | POA: Insufficient documentation

## 2023-05-20 DIAGNOSIS — J439 Emphysema, unspecified: Secondary | ICD-10-CM | POA: Insufficient documentation

## 2023-05-20 DIAGNOSIS — Z122 Encounter for screening for malignant neoplasm of respiratory organs: Secondary | ICD-10-CM | POA: Diagnosis not present

## 2023-05-20 DIAGNOSIS — I2510 Atherosclerotic heart disease of native coronary artery without angina pectoris: Secondary | ICD-10-CM | POA: Diagnosis not present

## 2023-05-20 DIAGNOSIS — F17210 Nicotine dependence, cigarettes, uncomplicated: Secondary | ICD-10-CM | POA: Diagnosis not present

## 2023-05-29 ENCOUNTER — Encounter: Payer: Self-pay | Admitting: Family Medicine

## 2023-06-03 ENCOUNTER — Other Ambulatory Visit: Payer: Self-pay | Admitting: Acute Care

## 2023-06-03 DIAGNOSIS — Z87891 Personal history of nicotine dependence: Secondary | ICD-10-CM

## 2023-06-03 DIAGNOSIS — Z122 Encounter for screening for malignant neoplasm of respiratory organs: Secondary | ICD-10-CM

## 2023-07-14 DIAGNOSIS — H9313 Tinnitus, bilateral: Secondary | ICD-10-CM | POA: Diagnosis not present

## 2023-07-14 DIAGNOSIS — H903 Sensorineural hearing loss, bilateral: Secondary | ICD-10-CM | POA: Diagnosis not present

## 2023-07-14 DIAGNOSIS — H838X3 Other specified diseases of inner ear, bilateral: Secondary | ICD-10-CM | POA: Diagnosis not present

## 2023-07-14 DIAGNOSIS — J339 Nasal polyp, unspecified: Secondary | ICD-10-CM | POA: Diagnosis not present

## 2023-08-23 ENCOUNTER — Other Ambulatory Visit: Payer: Self-pay | Admitting: Family Medicine

## 2023-08-23 DIAGNOSIS — K219 Gastro-esophageal reflux disease without esophagitis: Secondary | ICD-10-CM

## 2023-08-24 ENCOUNTER — Other Ambulatory Visit: Payer: Self-pay | Admitting: Family Medicine

## 2023-09-18 NOTE — Progress Notes (Signed)
 HPI: [name] is a 68 y.o.male with a PMHx significant for HTN, HLD, prediabetes, GERD, BPH, and hearing loss, who is here today for his routine physical examination.  Last CPE: 09/23/2022  Exercise: Patient states he is very active at work, walking 7-8 hours per shift.  Diet: He cooks at home and eats healthy in general. Eats vegetables daily.  Sleep: He works nights, so he sleeps 5-6 hours, is up for 3-4 hours, and then sleeps 3-4 more hours. He says he feels rested in general.  Alcohol Use: None Smoking: He quit smoking in 2022.  Vision: He is overdue for an eye exam.  Dental: UTD on routine dental care.   Immunization History  Administered Date(s) Administered   Influenza, Seasonal, Injecte, Preservative Fre 06/12/2015   Influenza,inj,Quad PF,6+ Mos 08/26/2016, 08/26/2017, 08/27/2018, 08/29/2019, 09/11/2020   PNEUMOCOCCAL CONJUGATE-20 09/23/2022   Pneumococcal Polysaccharide-23 08/26/2017   Tdap 09/04/2011, 09/04/2011   Zoster Recombinant(Shingrix) 08/27/2018, 03/11/2021   Health Maintenance  Topic Date Due   DTaP/Tdap/Td (3 - Td or Tdap) 09/03/2021   INFLUENZA VACCINE  03/12/2023   COVID-19 Vaccine (1 - 2024-25 season) Never done   Lung Cancer Screening  05/19/2024   Colonoscopy  09/29/2032   Pneumonia Vaccine 42+ Years old  Completed   Hepatitis C Screening  Completed   Zoster Vaccines- Shingrix  Completed   HPV VACCINES  Aged Out   Last prostate ca screening: 09/23/2022 BPH: Denies nocturia or changes in urinary frequency unless he drinks a lot of water before bed.  Lab Results  Component Value Date   PSA 3.11 09/23/2022   PSA 3.09 09/16/2021   PSA 3.28 09/11/2020   Chronic medical problems:   Hypertension:  Medications: Currently on lisinopril  20 mg daily.  He has not been checking his BP at home. In the office today, his BP is 142/86.  Negative for unusual or severe headache, visual changes, exertional chest pain, dyspnea,  focal weakness, or  edema.  Lab Results  Component Value Date   CREATININE 1.31 03/20/2023   BUN 20 03/20/2023   NA 137 03/20/2023   K 4.3 03/20/2023   CL 101 03/20/2023   CO2 30 03/20/2023   Hyperlipidemia: Currently on atorvastatin  40 mg daily.  Lab Results  Component Value Date   CHOL 136 09/23/2022   HDL 38.90 (L) 09/23/2022   LDLCALC 66 09/23/2022   TRIG 156.0 (H) 09/23/2022   CHOLHDL 3 09/23/2022   Prediabetes: Negative for polydipsia, polyphagia, polyuria. 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  He also has chronic tinnitus, and says it is worsening.  He says his hearing loss is starting to affect his work.  No significant FMHx of hearing loss.  [facility] his [facility] problems. 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 He brought copy of [facility] evaluation, he would like for me to review it and see if these conditions can all be related with noise exposure. Denies occupational exposure. Hearing aids have been changed but he feels like they  are bulky and not as comfortable as his old hearing aids, which he is still wearing.  Review of Systems  Constitutional:  Negative for activity change, appetite change, fatigue and fever.  HENT:  Positive for hearing loss. Negative for dental problem, nosebleeds, sore throat and trouble swallowing.   Eyes:  Negative for redness and visual disturbance.  Respiratory:  Negative for cough, shortness of breath and wheezing.   Cardiovascular:  Negative for chest pain, palpitations and leg swelling.  Gastrointestinal:  Negative for abdominal pain, blood in  stool, nausea and vomiting.  Endocrine: Negative for cold intolerance and heat intolerance.  Genitourinary:  Negative for decreased urine volume, dysuria, genital sores, hematuria and testicular pain.  Musculoskeletal:  Negative for gait problem and myalgias.  Skin:  Negative for color change and rash.  Allergic/Immunologic: Positive for environmental allergies.  Neurological:  Negative for syncope, weakness and headaches.  Hematological:  Negative for adenopathy. Does not bruise/bleed easily.  Psychiatric/Behavioral:  Negative for confusion. The patient is not nervous/anxious.   All other systems reviewed and are negative.  Current Outpatient Medications on File Prior to Visit  Medication Sig Dispense Refill   aspirin EC 81 MG tablet Take 81 mg by mouth daily. Swallow whole.     atorvastatin  (LIPITOR) 40 MG tablet TAKE 1 TABLET BY MOUTH EVERY DAY 90 tablet 3   clotrimazole -betamethasone  (LOTRISONE ) cream Apply 1 Application topically 2 (two) times daily as needed. 30 g 2   lisinopril  (ZESTRIL ) 20 MG tablet Take 1 tablet (20 mg total) by mouth daily. 90 tablet 2   omeprazole  (PRILOSEC) 20 MG capsule TAKE 1 CAPSULE BY MOUTH EVERY DAY 90 capsule 3   OVER THE COUNTER MEDICATION Pt states he is taking allergy med every day. Unable to recall name.     No current facility-administered medications on file prior to visit.   Past Medical History:  Diagnosis Date   Allergy    GERD (gastroesophageal reflux disease)    Hyperlipemia    Hypertension    Pneumonia    Past Surgical History:  Procedure Laterality Date   BACK SURGERY     Allergies  Allergen Reactions   Nsaids Anaphylaxis   Family History  Problem Relation Age of Onset   Arthritis Mother    Hyperlipidemia Father    Hypertension Father    Diabetes Father    Diabetes Maternal Grandfather    Social History   Socioeconomic History   Marital status: Married    Spouse name: Not on file   Number of children: Not on file    Years of education: Not on file   Highest education level: 12th grade  Occupational History   Not on file  Tobacco Use   Smoking status: Former    Current packs/day: 0.00    Average packs/day: 0.7 packs/day for 47.5 years (35.7 ttl pk-yrs)    Types: Cigarettes    Start date: 09/04/1973    Quit date: 03/18/2021    Years since quitting: 2.5   Smokeless tobacco: Current   Tobacco comments:    40 years on and off.    For several years he smoked 1 pack per week.  Vaping Use   Vaping status: Some Days   Substances: Nicotine   Substance and Sexual Activity   Alcohol use: Yes    Comment: rare   Drug use: No   Sexual activity: Not on file  Other Topics Concern   Not on file  Social History Narrative   Not on file  Social Drivers of Health   Financial Resource Strain: Patient Declined (03/19/2023)   Overall Financial Resource Strain (CARDIA)    Difficulty of Paying Living Expenses: Patient declined  Food Insecurity: Patient Declined (03/19/2023)   Hunger Vital Sign    Worried About Running Out of Food in the Last Year: Patient declined    Ran Out of Food in the Last Year: Patient declined  Transportation Needs: Patient Declined (03/19/2023)   PRAPARE - Administrator, Civil Service (Medical): Patient declined    Lack of Transportation (Non-Medical): Patient declined  Physical Activity: Unknown (03/19/2023)   Exercise Vital Sign    Days of Exercise per Week: 7 days    Minutes of Exercise per Session: Not on file  Stress: No Stress Concern Present (03/19/2023)   [employer] of Occupational Health - Occupational Stress Questionnaire    Feeling of Stress : Not at all  Social Connections: Unknown (03/19/2023)   Social Connection and Isolation Panel [NHANES]    Frequency of Communication with Friends and Family: Patient declined    Frequency of Social Gatherings with Friends and Family: Patient declined    Attends Religious Services: Patient declined    Active Member of Clubs  or Organizations: Not on file    Attends Banker Meetings: Not on file    Marital Status: Married   Today's Vitals   09/21/23 0654 09/21/23 0718  BP: (!) 142/86 (!) 145/90  Pulse: 70   Resp: 16   Temp: 98.6 F (37 C)   TempSrc: Oral   SpO2: 97%   Weight: 176 lb (79.8 kg)   Height: 5\' 10"  (1.778 m)    Body mass index is 25.25 kg/m.  Wt Readings from Last 3 Encounters:  09/21/23 176 lb (79.8 kg)  05/20/23 182 lb 1.6 oz (82.6 kg)  03/20/23 182 lb 3.2 oz (82.6 kg)   Physical Exam Vitals and nursing note reviewed.  Constitutional:      General: He is not in acute distress.    Appearance: He is well-developed.  HENT:     Head: Normocephalic and atraumatic.     Right Ear: Tympanic membrane, ear canal and external ear normal.     Left Ear: Tympanic membrane, ear canal and external ear normal.     Mouth/Throat:     Mouth: Mucous membranes are moist.     Pharynx: Oropharynx is clear. Uvula midline.  Eyes:     Conjunctiva/sclera: Conjunctivae normal.     Pupils: Pupils are equal, round, and reactive to light.  Neck:     Thyroid : No thyroid  mass or thyromegaly.  Cardiovascular:     Rate and Rhythm: Normal rate and regular rhythm.     Pulses:          Dorsalis pedis pulses are 2+ on the right side and 2+ on the left side.     Heart sounds: No murmur heard. Pulmonary:     Effort: Pulmonary effort is normal. No respiratory distress.     Breath sounds: Normal breath sounds.  Abdominal:     Palpations: Abdomen is soft. There is no hepatomegaly or mass.     Tenderness: There is no abdominal tenderness.  Genitourinary:    Comments: No concerns. Musculoskeletal:        General: No tenderness.     Cervical back: Normal range of motion.     Right lower leg: No edema.     Left lower leg: No edema.     Comments:  No signs of synovitis.  Lymphadenopathy:     Cervical: No cervical adenopathy.     Upper Body:     Right upper body: No supraclavicular adenopathy.      Left upper body: No supraclavicular adenopathy.  Skin:    General: Skin is warm.     Findings: No erythema.  Neurological:     General: No focal deficit present.     Mental Status: He is alert and oriented to person, place, and time.     Cranial Nerves: No cranial nerve deficit.     Sensory: No sensory deficit.     Gait: Gait normal.     Deep Tendon Reflexes:     Reflex Scores:      Bicep reflexes are 2+ on the right side and 2+ on the left side.      Patellar reflexes are 2+ on the right side and 2+ on the left side. Psychiatric:        Mood and Affect: Mood and affect normal.   ASSESSMENT AND PLAN:  Mr. Gillogly was seen today for his routine general medical examination.  Lab Results  Component Value Date   NA 139 09/21/2023   CL 103 09/21/2023   K 4.2 09/21/2023   CO2 29 09/21/2023   BUN 17 09/21/2023   CREATININE 1.06 09/21/2023   GFR 73.37 09/21/2023   CALCIUM  9.1 09/21/2023   ALBUMIN 4.4 09/21/2023   GLUCOSE 104 (H) 09/21/2023   Lab Results  Component Value Date   ALT 23 09/21/2023   AST 15 09/21/2023   ALKPHOS 92 09/21/2023   BILITOT 0.5 09/21/2023   Lab Results  Component Value Date   TSH 1.25 09/21/2023   Lab Results  Component Value Date   PSA 3.23 09/21/2023   PSA 3.11 09/23/2022   PSA 3.09 09/16/2021   Lab Results  Component Value Date   CHOL 92 09/21/2023   HDL 42.20 09/21/2023   LDLCALC 37 09/21/2023   TRIG 67.0 09/21/2023   CHOLHDL 2 09/21/2023   Lab Results  Component Value Date   HGBA1C 6.1 09/21/2023   Routine general medical examination at a health care facility Assessment & Plan: We discussed the importance of regular physical activity and healthy diet for prevention of chronic illness and/or complications. Preventive guidelines reviewed. Vaccination updated. Lung cancer screening up today, next due in 05/2024. Next CPE in a year.   Screening for endocrine, metabolic and immunity disorder -     Hemoglobin A1c;  Future  Essential hypertension Assessment & Plan: BP elevated today, recheck: 145/90. He agrees with adding amlodipine  5 mg daily, start with 1/2 tablet daily at bedtime. Continue lisinopril  20 mg daily. Monitor BP regularly and let me know about BP readings in 3 to 4 weeks. Follow-up in 6 months, before if needed.  Orders: -     Comprehensive metabolic panel; Future -     TSH; Future -     amLODIPine  Besylate; Take 1 tablet (5 mg total) by mouth daily.  Dispense: 90 tablet; Refill: 0  Mixed hyperlipidemia Assessment & Plan: Continue atorvastatin  40 mg daily and low-fat diet. Further recommendation will be given according to lipid panel results.  Orders: -     Lipid panel; Future  Benign prostatic hyperplasia without lower urinary tract symptoms Assessment & Plan: Stable. PSA ordered today.  Orders: -     PSA; Future  Sensorineural hearing loss (SNHL) of both ears Assessment & Plan: Following through the [place]. He is concerned that  this worsening right  hearing loss could also be related with military service as it was determined for his left hearing loss. He [facility]. Following with E[facility].   Need for pneumococcal vaccination -     Pneumococcal conjugate vaccine 20-valent   Return in 6 months (on 03/20/2024) for chronic problems.  I, Fritz Jewel Wierda, acting as a scribe for Tasheem Elms [place], MD., have documented all relevant documentation on the behalf of Edrick Whitehorn [place], MD, as directed by  Wilson Dusenbery [place], MD while in the presence of Yarelis Ambrosino [place], MD.   I, Ilee Randleman [place], MD, have reviewed all documentation for this visit. The documentation on 09/18/23 for the exam, diagnosis, procedures, and orders are all accurate and complete.  Boubacar Lerette G. [place], MD  [facility]. [place] office.

## 2023-09-21 ENCOUNTER — Encounter: Payer: Self-pay | Admitting: Family Medicine

## 2023-09-21 ENCOUNTER — Other Ambulatory Visit: Payer: Self-pay | Admitting: Family Medicine

## 2023-09-21 ENCOUNTER — Ambulatory Visit (INDEPENDENT_AMBULATORY_CARE_PROVIDER_SITE_OTHER): Payer: BC Managed Care – PPO | Admitting: Family Medicine

## 2023-09-21 VITALS — BP 145/90 | HR 70 | Temp 98.6°F | Resp 16 | Ht 70.0 in | Wt 176.0 lb

## 2023-09-21 DIAGNOSIS — Z23 Encounter for immunization: Secondary | ICD-10-CM | POA: Diagnosis not present

## 2023-09-21 DIAGNOSIS — Z0000 Encounter for general adult medical examination without abnormal findings: Secondary | ICD-10-CM | POA: Diagnosis not present

## 2023-09-21 DIAGNOSIS — I10 Essential (primary) hypertension: Secondary | ICD-10-CM | POA: Diagnosis not present

## 2023-09-21 DIAGNOSIS — Z130 Encounter for screening for diseases of the blood and blood-forming organs and certain disorders involving the immune mechanism: Secondary | ICD-10-CM | POA: Diagnosis not present

## 2023-09-21 DIAGNOSIS — Z1329 Encounter for screening for other suspected endocrine disorder: Secondary | ICD-10-CM

## 2023-09-21 DIAGNOSIS — N400 Enlarged prostate without lower urinary tract symptoms: Secondary | ICD-10-CM

## 2023-09-21 DIAGNOSIS — E782 Mixed hyperlipidemia: Secondary | ICD-10-CM

## 2023-09-21 DIAGNOSIS — H903 Sensorineural hearing loss, bilateral: Secondary | ICD-10-CM

## 2023-09-21 DIAGNOSIS — Z13228 Encounter for screening for other metabolic disorders: Secondary | ICD-10-CM

## 2023-09-21 LAB — COMPREHENSIVE METABOLIC PANEL
ALT: 23 U/L (ref 0–53)
AST: 15 U/L (ref 0–37)
Albumin: 4.4 g/dL (ref 3.5–5.2)
Alkaline Phosphatase: 92 U/L (ref 39–117)
BUN: 17 mg/dL (ref 6–23)
CO2: 29 meq/L (ref 19–32)
Calcium: 9.1 mg/dL (ref 8.4–10.5)
Chloride: 103 meq/L (ref 96–112)
Creatinine, Ser: 1.06 mg/dL (ref 0.40–1.50)
GFR: 73.37 mL/min (ref >60.00)
Glucose, Bld: 104 mg/dL — ABNORMAL HIGH (ref 70–99)
Potassium: 4.2 meq/L (ref 3.5–5.1)
Sodium: 139 meq/L (ref 135–145)
Total Bilirubin: 0.5 mg/dL (ref 0.2–1.2)
Total Protein: 6.4 g/dL (ref 6.0–8.3)

## 2023-09-21 LAB — LIPID PANEL
Cholesterol: 92 mg/dL (ref 0–200)
HDL: 42.2 mg/dL (ref >39.00)
LDL Cholesterol: 37 mg/dL (ref 0–99)
NonHDL: 50.06
Total CHOL/HDL Ratio: 2
Triglycerides: 67 mg/dL (ref 0.0–149.0)
VLDL: 13.4 mg/dL (ref 0.0–40.0)

## 2023-09-21 LAB — PSA: PSA: 3.23 ng/mL (ref 0.10–4.00)

## 2023-09-21 LAB — HEMOGLOBIN A1C: Hgb A1c MFr Bld: 6.1 % (ref 4.6–6.5)

## 2023-09-21 LAB — TSH: TSH: 1.25 u[IU]/mL (ref 0.35–5.50)

## 2023-09-21 MED ORDER — AMLODIPINE BESYLATE 5 MG PO TABS: 5.0000 mg | ORAL_TABLET | Freq: Every day | ORAL | 0 refills | Status: DC

## 2023-09-21 NOTE — Assessment & Plan Note (Addendum)
 Following through the [place]. He is concerned that this worsening right  hearing loss could also be related with military service as it was determined for his left hearing loss. He brought a [facility] of [facility] evaluations. Following with ENT.

## 2023-09-21 NOTE — Assessment & Plan Note (Signed)
 Stable. PSA ordered today.

## 2023-09-21 NOTE — Patient Instructions (Addendum)
 A few things to remember from today's visit:  Routine general medical examination at a health care facility  Screening for endocrine, metabolic and immunity disorder - Plan: Hemoglobin A1c  Essential hypertension - Plan: Comprehensive metabolic panel, TSH, amLODipine  (NORVASC ) 5 MG tablet  Mixed hyperlipidemia - Plan: Lipid panel  Benign prostatic hyperplasia without lower urinary tract symptoms - Plan: PSA  Sensorineural hearing loss (SNHL), unspecified laterality  Blood pressure elevated today, so Amlodipine  5 mg added, start with 1/2 tab at bedtime.No changes in Lisinopril . Monitor blood pressure and let me know about readings in 3-4 weeks.  If you need refills for medications you take chronically, please call your pharmacy. Do not use My Chart to request refills or for acute issues that need immediate attention. If you send a my chart message, it may take a few days to be addressed, specially if I am not in the office.  Please be sure medication list is accurate. If a new problem present, please set up appointment sooner than planned today.  Health Maintenance, Male Adopting a healthy lifestyle and getting preventive care are important in promoting health and wellness. Ask your health care provider about: The right schedule for you to have regular tests and exams. Things you can do on your own to prevent diseases and keep yourself healthy. What should I know about diet, weight, and exercise? Eat a healthy diet  Eat a diet that includes plenty of vegetables, fruits, low-fat dairy products, and lean protein. Do not eat a lot of foods that are high in solid fats, added sugars, or sodium. Maintain a healthy weight Body mass index (BMI) is a measurement that can be used to identify possible weight problems. It estimates body fat based on height and weight. Your health care provider can help determine your BMI and help you achieve or maintain a healthy weight. Get regular  exercise Get regular exercise. This is one of the most important things you can do for your health. Most adults should: Exercise for at least 150 minutes each week. The exercise should increase your heart rate and make you sweat (moderate-intensity exercise). Do strengthening exercises at least twice a week. This is in addition to the moderate-intensity exercise. Spend less time sitting. Even light physical activity can be beneficial. Watch cholesterol and blood lipids Have your blood tested for lipids and cholesterol at 66 years of age, then have this test every 5 years. You may need to have your cholesterol levels checked more often if: Your lipid or cholesterol levels are high. You are older than 66 years of age. You are at high risk for heart disease. What should I know about cancer screening? Many types of cancers can be detected early and may often be prevented. Depending on your health history and family history, you may need to have cancer screening at various ages. This may include screening for: Colorectal cancer. Prostate cancer. Skin cancer. Lung cancer. What should I know about heart disease, diabetes, and high blood pressure? Blood pressure and heart disease High blood pressure causes heart disease and increases the risk of stroke. This is more likely to develop in people who have high blood pressure readings or are overweight. Talk with your health care provider about your target blood pressure readings. Have your blood pressure checked: Every 3-5 years if you are 66-71 years of age. Every year if you are 60 years old or older. If you are between the ages of 66 and 20 and are a current or  former smoker, ask your health care provider if you should have a one-time screening for abdominal aortic aneurysm (AAA). Diabetes Have regular diabetes screenings. This checks your fasting blood sugar level. Have the screening done: Once every three years after age 18 if you are at a  normal weight and have a low risk for diabetes. More often and at a younger age if you are overweight or have a high risk for diabetes. What should I know about preventing infection? Hepatitis B If you have a higher risk for hepatitis B, you should be screened for this virus. Talk with your health care provider to find out if you are at risk for hepatitis B infection. Hepatitis C Blood testing is recommended for: Everyone born from [birth year] through 1963. Anyone with known risk factors for hepatitis C. Sexually transmitted infections (STIs) You should be screened each year for STIs, including gonorrhea and chlamydia, if: You are sexually active and are younger than 66 years of age. You are older than 66 years of age and your health care provider tells you that you are at risk for this type of infection. Your sexual activity has changed since you were last screened, and you are at increased risk for chlamydia or gonorrhea. Ask your health care provider if you are at risk. Ask your health care provider about whether you are at high risk for HIV. Your health care provider may recommend a prescription medicine to help prevent HIV infection. If you choose to take medicine to prevent HIV, you should first get tested for HIV. You should then be tested every 3 months for as long as you are taking the medicine. Follow these instructions at home: Alcohol use Do not drink alcohol if your health care provider tells you not to drink. If you drink alcohol: Limit how much you have to 0-2 drinks a day. Know how much alcohol is in your drink. In the [place] one drink equals one 12 oz bottle of beer (355 mL), one 5 oz glass of wine (148 mL), or one 1 oz glass of hard liquor (44 mL). Lifestyle Do not use any products that contain nicotine  or tobacco. These products include cigarettes, chewing tobacco, and vaping devices, such as e-cigarettes. If you need help quitting, ask your health care provider. Do not use street  drugs. Do not share needles. Ask your health care provider for help if you need support or information about quitting drugs. General instructions Schedule regular health, dental, and eye exams. Stay current with your vaccines. Tell your health care provider if: You often feel depressed. You have ever been abused or do not feel safe at home. Summary Adopting a healthy lifestyle and getting preventive care are important in promoting health and wellness. Follow your health care provider's instructions about healthy diet, exercising, and getting tested or screened for diseases. Follow your health care provider's instructions on monitoring your cholesterol and blood pressure. This information is not intended to replace advice given to you by your health care provider. Make sure you discuss any questions you have with your health care provider. Document Revised: 12/17/2020 Document Reviewed: 12/17/2020 Elsevier Patient Education  2024 [employer].

## 2023-09-21 NOTE — Assessment & Plan Note (Signed)
Continue atorvastatin 40 mg daily and low-fat diet. Further recommendation will be given according to lipid panel results. 

## 2023-09-21 NOTE — Assessment & Plan Note (Signed)
 BP elevated today, recheck: 145/90. He agrees with adding amlodipine  5 mg daily, start with 1/2 tablet daily at bedtime. Continue lisinopril  20 mg daily. Monitor BP regularly and let me know about BP readings in 3 to 4 weeks. Follow-up in 6 months, before if needed.

## 2023-09-21 NOTE — Assessment & Plan Note (Addendum)
 We discussed the importance of regular physical activity and healthy diet for prevention of chronic illness and/or complications. Preventive guidelines reviewed. Vaccination updated. Lung cancer screening up today, next due in 05/2024. Next CPE in a year.

## 2023-12-17 ENCOUNTER — Other Ambulatory Visit: Payer: Self-pay | Admitting: Family Medicine

## 2023-12-17 DIAGNOSIS — I10 Essential (primary) hypertension: Secondary | ICD-10-CM

## 2023-12-21 ENCOUNTER — Encounter (HOSPITAL_BASED_OUTPATIENT_CLINIC_OR_DEPARTMENT_OTHER): Payer: Self-pay

## 2023-12-21 ENCOUNTER — Emergency Department (HOSPITAL_BASED_OUTPATIENT_CLINIC_OR_DEPARTMENT_OTHER)

## 2023-12-21 ENCOUNTER — Emergency Department (HOSPITAL_BASED_OUTPATIENT_CLINIC_OR_DEPARTMENT_OTHER)
Admission: EM | Admit: 2023-12-21 | Discharge: 2023-12-21 | Disposition: A | Discharge status: Home / Self Care | Attending: Emergency Medicine | Admitting: Emergency Medicine

## 2023-12-21 DIAGNOSIS — R059 Cough, unspecified: Secondary | ICD-10-CM | POA: Diagnosis not present

## 2023-12-21 DIAGNOSIS — Z7982 Long term (current) use of aspirin: Secondary | ICD-10-CM | POA: Diagnosis not present

## 2023-12-21 DIAGNOSIS — B9789 Other viral agents as the cause of diseases classified elsewhere: Secondary | ICD-10-CM | POA: Diagnosis not present

## 2023-12-21 DIAGNOSIS — J069 Acute upper respiratory infection, unspecified: Secondary | ICD-10-CM | POA: Diagnosis not present

## 2023-12-21 DIAGNOSIS — I10 Essential (primary) hypertension: Secondary | ICD-10-CM | POA: Insufficient documentation

## 2023-12-21 DIAGNOSIS — Z87891 Personal history of nicotine dependence: Secondary | ICD-10-CM | POA: Insufficient documentation

## 2023-12-21 DIAGNOSIS — R0989 Other specified symptoms and signs involving the circulatory and respiratory systems: Secondary | ICD-10-CM | POA: Diagnosis not present

## 2023-12-21 DIAGNOSIS — R0789 Other chest pain: Secondary | ICD-10-CM | POA: Diagnosis not present

## 2023-12-21 DIAGNOSIS — Z79899 Other long term (current) drug therapy: Secondary | ICD-10-CM | POA: Insufficient documentation

## 2023-12-21 LAB — RESP PANEL BY RT-PCR (RSV, FLU A&B, COVID)  RVPGX2
Influenza A by PCR: NEGATIVE
Influenza B by PCR: NEGATIVE
Resp Syncytial Virus by PCR: NEGATIVE
SARS Coronavirus 2 by RT PCR: NEGATIVE

## 2023-12-21 MED ORDER — BENZONATATE 100 MG PO CAPS: 100.0000 mg | ORAL_CAPSULE | Freq: Three times a day (TID) | ORAL | 0 refills | Status: DC | PRN

## 2023-12-21 NOTE — ED Triage Notes (Signed)
 Pt states that he has been having cough and congestion for the past few days, dry non productive cough. C/o of sinus pressure, denies fevers.

## 2023-12-21 NOTE — ED Provider Notes (Signed)
 [facility]ERGENCY [facility]PARTMENT AT [facility] Provider Note   CSN: [id number] Arrival date & time: 12/21/23  0350     History  Chief Complaint  Patient presents with   Nasal Congestion    Steven Cook is a 68 y.o. male.  The history is provided by the patient.   Steven Cook is a 68 y.o. male who presents to the Emergency Department complaining of congestion.  He presents to the emergency department for evaluation of chest congestion.  On Friday he started feeling unwell with head congestion, runny nose.  On Sunday he started coughing and has a nonproductive cough.  He feels fatigued.  He does not feel short of breath.  When he takes a deep breath he feels like he needs to cough.  He does have some upper chest discomfort on coughing.  He has a personal history of hypertension.  No history of DVT or PE.  He is a former smoker.  No history of reactive airway.     Home Medications Prior to Admission medications   Medication Sig Start Date End Date Taking? Authorizing Provider  benzonatate  (TESSALON ) 100 MG capsule Take 1 capsule (100 mg total) by mouth 3 (three) times daily as needed. 12/21/23  Yes Kelsey Patricia, MD  amLODipine  (NORVASC ) 5 MG tablet TAKE 1 TABLET (5 MG TOTAL) BY MOUTH DAILY. 12/18/23   [place], Betty G, MD  aspirin EC 81 MG tablet Take 81 mg by mouth daily. Swallow whole.    [provider]  atorvastatin  (LIPITOR) 40 MG tablet TAKE 1 TABLET BY MOUTH EVERY DAY 08/24/23   [place], Betty G, MD  clotrimazole -betamethasone  (LOTRISONE ) cream Apply 1 Application topically 2 (two) times daily as needed. 03/20/23   [place], Betty G, MD  lisinopril  (ZESTRIL ) 20 MG tablet TAKE 1 TABLET BY MOUTH EVERY DAY 09/21/23   [place], Betty G, MD  omeprazole  (PRILOSEC) 20 MG capsule TAKE 1 CAPSULE BY MOUTH EVERY DAY 08/24/23   [place], Betty G, MD  OVER THE COUNTER MEDICATION Pt states he is taking allergy med every day. Unable to recall name.    [provider]      Allergies    Nsaids    Review of Systems   Review of Systems  All other systems reviewed and are negative.   Physical Exam Updated Vital Signs BP (!) 153/101 (BP Location: Right Arm)   Pulse 92   Temp 98.6 F (37 C) (Oral)   Resp 18   SpO2 98%  Physical Exam Vitals and nursing note reviewed.  Constitutional:      Appearance: He is well-developed.  HENT:     Head: Normocephalic and atraumatic.  Cardiovascular:     Rate and Rhythm: Normal rate and regular rhythm.     Heart sounds: No murmur heard. Pulmonary:     Effort: Pulmonary effort is normal. No respiratory distress.     Breath sounds: Normal breath sounds.  Abdominal:     Palpations: Abdomen is soft.     Tenderness: There is no abdominal tenderness. There is no guarding or rebound.  Musculoskeletal:        General: No swelling or tenderness.  Skin:    General: Skin is warm and dry.  Neurological:     Mental Status: He is alert and oriented to person, place, and time.  Psychiatric:        Behavior: Behavior normal.     ED Results / Procedures / Treatments   Labs (all  labs ordered are listed, but only abnormal results are displayed) Labs Reviewed  RESP PANEL BY RT-PCR (RSV, FLU A&B, COVID)  [id number]    EKG None ED ECG REPORT   Date: 12/21/2023  Rate: 78  Rhythm: normal sinus rhythm  QRS Axis: normal  Intervals: normal  ST/T Wave abnormalities: normal  Conduction Disutrbances:none  Narrative Interpretation:   Old EKG Reviewed: none available  I have personally reviewed the EKG tracing and agree with the computerized printout as noted.  Radiology DG Chest 2 View Result Date: 12/21/2023 CLINICAL DATA:  Cough and congestion EXAM: CHEST - 2 VIEW COMPARISON:  09/16/2021 FINDINGS: Extensive artifact from EKG leads. Normal heart size and mediastinal contours. No acute infiltrate or edema. No effusion or pneumothorax. No acute osseous findings. IMPRESSION: No active cardiopulmonary disease.  Electronically Signed   By: Ronnette Coke M.D.   On: 12/21/2023 05:40    Procedures Procedures    Medications Ordered in ED Medications - No data to display  ED Course/ Medical Decision Making/ A&P                                 Medical Decision Making Amount and/or Complexity of Data Reviewed Radiology: ordered.  Risk Prescription drug management.   Patient with history of hypertension here for evaluation of cough, nasal congestion.  He does have chest discomfort with his cough.  EKG is nonischemic.  Chest x-ray is negative for acute infiltrate-images personally reviewed and interpreted, agree with radiologist interpretation.  He is negative for COVID and influenza.  Current clinical picture is not consistent with ACS, PE, acute CHF, pneumonia.  Discussed with patient likely viral URI.  Discussed home care.  Will prescribe Tessalon  for cough.  Discussed outpatient follow-up as well as return precautions.        Final Clinical Impression(s) / ED Diagnoses Final diagnoses:  Viral URI with cough    Rx / DC Orders ED Discharge Orders          Ordered    benzonatate  (TESSALON ) 100 MG capsule  3 times daily PRN        12/21/23 0548              Kelsey Patricia, MD 12/21/23 [phone]

## 2024-02-02 ENCOUNTER — Encounter (HOSPITAL_BASED_OUTPATIENT_CLINIC_OR_DEPARTMENT_OTHER): Payer: Self-pay | Admitting: Emergency Medicine

## 2024-02-02 ENCOUNTER — Other Ambulatory Visit: Payer: Self-pay

## 2024-02-02 ENCOUNTER — Emergency Department (HOSPITAL_BASED_OUTPATIENT_CLINIC_OR_DEPARTMENT_OTHER)
Admission: EM | Admit: 2024-02-02 | Discharge: 2024-02-02 | Disposition: A | Discharge status: Home / Self Care | Attending: Emergency Medicine | Admitting: Emergency Medicine

## 2024-02-02 DIAGNOSIS — Z7982 Long term (current) use of aspirin: Secondary | ICD-10-CM | POA: Diagnosis not present

## 2024-02-02 DIAGNOSIS — W57XXXA Bitten or stung by nonvenomous insect and other nonvenomous arthropods, initial encounter: Secondary | ICD-10-CM | POA: Insufficient documentation

## 2024-02-02 DIAGNOSIS — S20469A Insect bite (nonvenomous) of unspecified back wall of thorax, initial encounter: Secondary | ICD-10-CM | POA: Diagnosis not present

## 2024-02-02 DIAGNOSIS — Z79899 Other long term (current) drug therapy: Secondary | ICD-10-CM | POA: Diagnosis not present

## 2024-02-02 DIAGNOSIS — S30860A Insect bite (nonvenomous) of lower back and pelvis, initial encounter: Secondary | ICD-10-CM | POA: Diagnosis not present

## 2024-02-02 DIAGNOSIS — I10 Essential (primary) hypertension: Secondary | ICD-10-CM | POA: Diagnosis not present

## 2024-02-02 MED ORDER — DOXYCYCLINE HYCLATE 100 MG PO CAPS: 100.0000 mg | ORAL_CAPSULE | Freq: Two times a day (BID) | ORAL | 0 refills | Status: DC

## 2024-02-02 NOTE — ED Triage Notes (Signed)
 Pt had tick bite on 6/16, spouse removed it , skin rash and irritation around the affected area , obvious redness and some swelling .  Denies further symptoms , no fever or chills

## 2024-02-02 NOTE — ED Provider Notes (Signed)
 [facility] EMERGENCY [facility]MENT AT [facility] Provider Note   CSN: [id number] Arrival date & time: 02/02/24  1347     Patient presents with: Tick Removal   Steven Cook is a 68 y.o. male.  With past medical history of hypertension, hyperlipidemia presents to emergency room with complaint of tick bite.  Tick was removed 6/18, unsure how long it was attached. Started noting rash 6/19 in evening. No arthralgia, no fatigue, no fever.    HPI     Prior to Admission medications   Medication Sig Start Date End Date Taking? Authorizing Provider  amLODipine  (NORVASC ) 5 MG tablet TAKE 1 TABLET (5 MG TOTAL) BY MOUTH DAILY. 12/18/23   [place], Betty G, MD  aspirin EC 81 MG tablet Take 81 mg by mouth daily. Swallow whole.    [provider]  atorvastatin  (LIPITOR) 40 MG tablet TAKE 1 TABLET BY MOUTH EVERY DAY 08/24/23   [place], Betty G, MD  benzonatate  (TESSALON ) 100 MG capsule Take 1 capsule (100 mg total) by mouth 3 (three) times daily as needed. 12/21/23   Griselda Norris, MD  clotrimazole -betamethasone  (LOTRISONE ) cream Apply 1 Application topically 2 (two) times daily as needed. 03/20/23   [place], Betty G, MD  lisinopril  (ZESTRIL ) 20 MG tablet TAKE 1 TABLET BY MOUTH EVERY DAY 09/21/23   [place], Betty G, MD  omeprazole  (PRILOSEC) 20 MG capsule TAKE 1 CAPSULE BY MOUTH EVERY DAY 08/24/23   [place], Betty G, MD  OVER THE COUNTER MEDICATION Pt states he is taking allergy med every day. Unable to recall name.    [provider]    Allergies: Nsaids    Review of Systems  Updated Vital Signs BP 119/84 (BP Location: Left Arm)   Pulse 80   Temp 98.2 F (36.8 C) (Oral)   Resp 16   Wt 81.6 kg   SpO2 97%   BMI 25.83 kg/m   Physical Exam Vitals and nursing note reviewed.  Constitutional:      General: He is not in acute distress.    Appearance: He is not toxic-appearing.  HENT:     Head: Normocephalic and atraumatic.   Eyes:     General: No scleral icterus.     Conjunctiva/sclera: Conjunctivae normal.    Cardiovascular:     Rate and Rhythm: Normal rate and regular rhythm.     Pulses: Normal pulses.     Heart sounds: Normal heart sounds.  Pulmonary:     Effort: Pulmonary effort is normal. No respiratory distress.     Breath sounds: Normal breath sounds.  Abdominal:     General: Abdomen is flat. Bowel sounds are normal.     Palpations: Abdomen is soft.     Tenderness: There is no abdominal tenderness.   Skin:    General: Skin is warm and dry.     Findings: No lesion.   Neurological:     General: No focal deficit present.     Mental Status: He is alert and oriented to person, place, and time. Mental status is at baseline.      (all labs ordered are listed, but only abnormal results are displayed) Labs Reviewed - No data to display  EKG: None  Radiology: No results found.   Procedures   Medications Ordered in the ED - No data to display  Medical Decision Making Risk Prescription drug management.   This patient presents to the ED for concern of tick bite, this involves an extensive number of treatment options, and is a complaint that carries with it a high risk of complications and morbidity.  The differential diagnosis includes tick bite, lyme, rmsf, cellulitis, abscess     Problem List / ED Course / Critical interventions / Medication management  Patient reports emergency room with complaint of tick bite.  Unsure how long the tick was attached but noticed a tick about a week ago.  The tick was removed today think it was a deer tick.  Since then he has been hemodynamically stable well-appearing but has had this slowly progressing rash around tick bite.  He denies fevers chills or signs of systemic illness.  He has no arthralgias.  No other rash.  Given tick bite will treat for Lyme's disease and other tickborne illness with doxycycline .  Patient will pick up his antibiotic today.  He was  given return precautions and follow-up instructions.  I have reviewed the patients home medicines and have made adjustments as needed   Plan F/u w/ PCP in 2-3d to ensure resolution of sx.  Patient was given return precautions. Patient stable for discharge at this time.  Patient educated on sx/dx and verbalized understanding of plan. Return to ER w/ new or worsening sx.       Final diagnoses:  Tick bite of back, initial encounter    ED Discharge Orders          Ordered    doxycycline  (VIBRAMYCIN ) 100 MG capsule  2 times daily        02/02/24 1419               Dacie Mandel, Warren SAILOR, PA-C 02/02/24 1625    Yolande Lamar BROCKS, MD 02/04/24 1429

## 2024-02-15 ENCOUNTER — Encounter: Payer: Self-pay | Admitting: Family Medicine

## 2024-02-15 ENCOUNTER — Ambulatory Visit: Admitting: Family Medicine

## 2024-02-15 VITALS — BP 138/80 | HR 79 | Resp 16 | Ht 70.0 in | Wt 177.0 lb

## 2024-02-15 DIAGNOSIS — H903 Sensorineural hearing loss, bilateral: Secondary | ICD-10-CM

## 2024-02-15 DIAGNOSIS — F419 Anxiety disorder, unspecified: Secondary | ICD-10-CM | POA: Insufficient documentation

## 2024-02-15 DIAGNOSIS — R200 Anesthesia of skin: Secondary | ICD-10-CM

## 2024-02-15 DIAGNOSIS — R202 Paresthesia of skin: Secondary | ICD-10-CM

## 2024-02-15 DIAGNOSIS — R0981 Nasal congestion: Secondary | ICD-10-CM | POA: Diagnosis not present

## 2024-02-15 MED ORDER — FLUTICASONE PROPIONATE 50 MCG/ACT NA SUSP: 1.0000 | Freq: Every evening | NASAL | 3 refills | Status: DC | PRN

## 2024-02-15 MED ORDER — SERTRALINE HCL 25 MG PO TABS: 25.0000 mg | ORAL_TABLET | Freq: Every day | ORAL | 3 refills | Status: DC

## 2024-02-15 NOTE — Assessment & Plan Note (Addendum)
 No prior history. Problem is caused by worsening hearing loss and its impact on daily activities. Discussed treatment options. Recommend trying CBT, he will try to arrange appt through the [facility]. He would like to try medication. His wife is on Sertraline , he agrees with trying same medication at 25 mg daily. We reviewed some side effects. He has an appointment in about 6 weeks. Instructed about warning signs.

## 2024-02-15 NOTE — Patient Instructions (Addendum)
 A few things to remember from today's visit:  Sensorineural hearing loss (SNHL) of both ears  Anxiety disorder, unspecified type - Plan: sertraline  (ZOLOFT ) 25 MG tablet  Nasal congestion - Plan: fluticasone  (FLONASE ) 50 MCG/ACT nasal spray Today we started Sertraline , this type of medications can increase suicidal risk. This is more prevalent among children,adolecents, and young adults with major depression or other psychiatric disorders. It can also make depression worse. Most common side effects are gastrointestinal, self limited after a few weeks: diarrhea, nausea, constipation  Or diarrhea among some.  In general it is well tolerated. We will follow closely.  Try Flonase  nasal spray at bedtime.  If you need refills for medications you take chronically, please call your pharmacy. Do not use My Chart to request refills or for acute issues that need immediate attention. If you send a my chart message, it may take a few days to be addressed, specially if I am not in the office.  Please be sure medication list is accurate. If a new problem present, please set up appointment sooner than planned today.

## 2024-02-15 NOTE — Assessment & Plan Note (Signed)
 Right-sided gradually getting worse. Hearing aid do not seem to be as effective as he would like to, problem is affecting social interaction and causing anxiety. Having hearing test regularly through the [facility] and following with ENT.

## 2024-02-15 NOTE — Progress Notes (Signed)
 ACUTE VISIT Chief Complaint  Patient presents with   Anxiety   HPI: Steven Cook is a 68 y.o. male, with a PMHx significant for HTN, HLD, prediabetes, GERD, BPH, and hearing loss, who is here today, accompanied by his wife, complaining of Anxiety; Hearing Loss.   Bilateral Hearing Loss: Chronic. Followed by [facility] as he has hx of hearing loss in his left ear related to noise exposure during his [employer], as well as ENT - last saw ENT on 03/20/2023.  Does have hx of tinnitus - which he says also affects his hearing and also prevents him from sleeping restfully.  Background white noise helps.  Hearing loss managed with bilateral hearing aids - says he'd gotten new ones but they did not seem to help so reverted back to his old hearing aids.  Notes that when laying on his back he also has difficulties sleeping as it feels like he is choking - will occasionally wake up with a dry mouth and says it seems like his uvula is swollen and causing a blockage.  + Rhinorrhea and nasal congestion, the latter one worse when sleeping on his back, better when he sleeps on his side. Positive for snoring, negative for apnea according to wife.   Anxiety: He says that he has been having feelings like something bad will happen/has happened Attributes his anxiety to his hearing loss as it is affecting his daily life, causing frustration at work and socially. He is now avoiding interaction with friends.     02/15/2024    5:41 PM 09/21/2023    7:02 AM 03/20/2023    7:12 AM 09/23/2022    7:05 AM 03/24/2022    2:32 PM  Depression screen PHQ 2/9  Decreased Interest 2 0 0 0 0  Down, Depressed, Hopeless 0 0 0 0 0  PHQ - 2 Score 2 0 0 0 0  Altered sleeping 3  3  0  Tired, decreased energy 1  0  0  Change in appetite 0  0  0  Feeling bad or failure about yourself  1  0  0  Trouble concentrating 1  0  0  Moving slowly or fidgety/restless 1  0  0  Suicidal thoughts 0  0  0  PHQ-9 Score 9  3  0   Difficult doing work/chores Somewhat difficult  Not difficult at all  Not difficult at all      02/15/2024    5:42 PM 03/20/2023    7:13 AM  GAD 7 : Generalized Anxiety Score  Nervous, Anxious, on Edge 1 0  Control/stop worrying 1 0  Worry too much - different things 1 0  Trouble relaxing 2 0  Restless 2 0  Easily annoyed or irritable 0 1  Afraid - awful might happen 1 0  Total GAD 7 Score 8 1  Anxiety Difficulty Somewhat difficult Somewhat difficult   Mentions tingling sensation in his feet/ distal legs, not often but usually when getting ready to go to work, going on for the past 3-4 months. Denies associated leg pain, erythema,weakness, or lower back pain.  Resolves once he starts walking.  Review of Systems  Constitutional:  Positive for fatigue. Negative for activity change, appetite change, chills and unexpected weight change.  HENT:  Positive for hearing loss. Negative for sore throat.   Respiratory:  Negative for cough, shortness of breath and wheezing.   Cardiovascular:  Negative for chest pain.  Gastrointestinal:  Negative for abdominal pain,  nausea and vomiting.  Genitourinary:  Negative for decreased urine volume, dysuria and hematuria.  Skin:  Negative for rash.  Neurological:  Negative for syncope and headaches.       (+) Tingling in feet/legs x 3-4 months, occasional (-) leg/lower back pain  Psychiatric/Behavioral:  The patient is nervous/anxious (attributes to hearing loss).   See other pertinent positives and negatives in HPI.  Current Outpatient Medications on File Prior to Visit  Medication Sig Dispense Refill   amLODipine  (NORVASC ) 5 MG tablet TAKE 1 TABLET (5 MG TOTAL) BY MOUTH DAILY. 90 tablet 3   aspirin EC 81 MG tablet Take 81 mg by mouth daily. Swallow whole.     atorvastatin  (LIPITOR) 40 MG tablet TAKE 1 TABLET BY MOUTH EVERY DAY 90 tablet 3   benzonatate  (TESSALON ) 100 MG capsule Take 1 capsule (100 mg total) by mouth 3 (three) times daily as needed. 21  capsule 0   clotrimazole -betamethasone  (LOTRISONE ) cream Apply 1 Application topically 2 (two) times daily as needed. 30 g 2   lisinopril  (ZESTRIL ) 20 MG tablet TAKE 1 TABLET BY MOUTH EVERY DAY 90 tablet 2   omeprazole  (PRILOSEC) 20 MG capsule TAKE 1 CAPSULE BY MOUTH EVERY DAY 90 capsule 3   OVER THE COUNTER MEDICATION Pt states he is taking allergy med every day. Unable to recall name.     No current facility-administered medications on file prior to visit.    Past Medical History:  Diagnosis Date   Allergy    Arthritis 2020   GERD (gastroesophageal reflux disease)    Hyperlipemia    Hypertension    Pneumonia    Allergies  Allergen Reactions   Nsaids Anaphylaxis    Social History   Socioeconomic History   Marital status: Married    Spouse name: Not on file   Number of children: Not on file   Years of education: Not on file   Highest education level: 12th grade  Occupational History   Not on file  Tobacco Use   Smoking status: Former    Current packs/day: 0.00    Average packs/day: 0.7 packs/day for 47.5 years (35.7 ttl pk-yrs)    Types: Cigarettes    Start date: 09/04/1973    Quit date: 03/18/2021    Years since quitting: 2.9   Smokeless tobacco: Current   Tobacco comments:    40 years on and off.    For several years he smoked 1 pack per week.  Vaping Use   Vaping status: Some Days   Substances: Nicotine   Substance and Sexual Activity   Alcohol use: Yes    Comment: rare   Drug use: No   Sexual activity: Not on file  Other Topics Concern   Not on file  Social History Narrative   Not on file   Social Drivers of Health   Financial Resource Strain: Patient Declined (09/19/2023)   Overall Financial Resource Strain (CARDIA)    Difficulty of Paying Living Expenses: Patient declined  Food Insecurity: Patient Declined (09/19/2023)   Hunger Vital Sign    Worried About Running Out of Food in the Last Year: Patient declined    Ran Out of Food in the Last Year: Patient  declined  Transportation Needs: Patient Declined (09/19/2023)   PRAPARE - Transportation    Lack of Transportation (Medical): Patient declined    Lack of Transportation (Non-Medical): Patient declined  Physical Activity: Unknown (09/19/2023)   Exercise Vital Sign    Days of Exercise per Week: Patient declined  Minutes of Exercise per Session: Not on file  Stress: No Stress Concern Present (03/19/2023)   [employer] of Occupational Health - Occupational Stress Questionnaire    Feeling of Stress : Not at all  Social Connections: Unknown (09/19/2023)   Social Connection and Isolation Panel    Frequency of Communication with Friends and Family: Patient declined    Frequency of Social Gatherings with Friends and Family: Patient declined    Attends Religious Services: Patient declined    Database administrator or Organizations: Patient declined    Attends Banker Meetings: Not on file    Marital Status: Patient declined   Vitals:   02/15/24 1447  BP: 138/80  Pulse: 79  Resp: 16  SpO2: 99%   Body mass index is 25.4 kg/m.  Physical Exam Constitutional:      General: He is not in acute distress.    Appearance: He is well-developed.  HENT:     Head: Normocephalic and atraumatic.     Right Ear: Decreased hearing noted.     Left Ear: Decreased hearing noted.     Ears:     Comments: Haring aids on.    Nose: Septal deviation present.     Right Turbinates: Enlarged.     Left Turbinates: Enlarged.     Mouth/Throat:     Mouth: Mucous membranes are moist.     Pharynx: Oropharynx is clear.  Eyes:     Conjunctiva/sclera: Conjunctivae normal.  Cardiovascular:     Rate and Rhythm: Normal rate and regular rhythm.     Heart sounds: No murmur heard. Pulmonary:     Effort: Pulmonary effort is normal. No respiratory distress.     Breath sounds: Normal breath sounds.  Skin:    General: Skin is warm.     Findings: No erythema.  Neurological:     General: No focal deficit  present.     Mental Status: He is alert and oriented to person, place, and time.     Gait: Gait normal.  Psychiatric:        Attention and Perception: Attention and perception normal.        Mood and Affect: Mood is anxious. Affect is labile.        Thought Content: Thought content normal. Thought content does not include homicidal or suicidal ideation. Thought content does not include homicidal or suicidal plan.   ASSESSMENT AND PLAN: Mr.Robey Koua Deeg was seen here today for Anxiety; Hearing Loss.   Sensorineural hearing loss (SNHL) of both ears Assessment & Plan: Right-sided gradually getting worse. Hearing aid do not seem to be as effective as he would like to, problem is affecting social interaction and causing anxiety. Having hearing test regularly through the [facility] and following with ENT.  Nasal congestion This problem could be contributing to his insomnia as well as dry mouth in the morning. OSA is in the differential Dx. Recommend trying Flonase  nasal spray at bedtime.  -     Fluticasone  Propionate; Place 1-2 sprays into both nostrils at bedtime as needed for allergies or rhinitis.  Dispense: 16 g; Refill: 3  Anxiety disorder, unspecified type Assessment & Plan: No prior history. Problem is caused by worsening hearing loss and its impact on daily activities. Discussed treatment options. Recommend trying CBT, he will try to arrange appt through the [facility]. He would like to try medication. His wife is on Sertraline , he agrees with trying same medication at 25 mg daily. We reviewed some side  effects. He has an appointment in about 6 weeks. Instructed about warning signs.  Orders: -     Sertraline  HCl; Take 1 tablet (25 mg total) by mouth daily.  Dispense: 30 tablet; Refill: 3  Numbness and tingling of both lower extremities Tingling sensation noted when getting up to go to work for the past 3 to 4 months, improved with movement. We discussed possible etiologies, he thinks  it is related to anxiety. We can check B12 with next blood work. Monitor for new symptoms.  I spent a total of 36 minutes in both face to face and non face to face activities for this visit on the date of this encounter. During this time history was obtained and documented, examination was performed, and assessment/plan discussed.  Return if symptoms worsen or fail to improve, for keep next appointment.  I,Emily Lagle,acting as a Neurosurgeon for Keymani Mclean [place], MD.,have documented all relevant documentation on the behalf of Halimah Bewick [place], MD,as directed by  Emmely Bittinger [place], MD while in the presence of Cyd Hostler [place], MD.  I, Aashish Hamm [place], MD, have reviewed all documentation for this visit. The documentation on 02/15/24 for the exam, diagnosis, procedures, and orders are all accurate and complete. Morris Longenecker G. [place], MD  [facility]. [place] office.

## 2024-03-29 ENCOUNTER — Ambulatory Visit: Payer: BC Managed Care – PPO | Admitting: Family Medicine

## 2024-03-29 ENCOUNTER — Ambulatory Visit: Payer: Self-pay | Admitting: Family Medicine

## 2024-03-29 ENCOUNTER — Encounter: Payer: Self-pay | Admitting: Family Medicine

## 2024-03-29 VITALS — BP 138/82 | HR 65 | Temp 98.3°F | Resp 16 | Ht 70.0 in | Wt 172.0 lb

## 2024-03-29 DIAGNOSIS — F419 Anxiety disorder, unspecified: Secondary | ICD-10-CM | POA: Diagnosis not present

## 2024-03-29 DIAGNOSIS — R634 Abnormal weight loss: Secondary | ICD-10-CM | POA: Diagnosis not present

## 2024-03-29 DIAGNOSIS — E782 Mixed hyperlipidemia: Secondary | ICD-10-CM

## 2024-03-29 DIAGNOSIS — R7303 Prediabetes: Secondary | ICD-10-CM | POA: Diagnosis not present

## 2024-03-29 DIAGNOSIS — I10 Essential (primary) hypertension: Secondary | ICD-10-CM | POA: Diagnosis not present

## 2024-03-29 DIAGNOSIS — Z23 Encounter for immunization: Secondary | ICD-10-CM | POA: Diagnosis not present

## 2024-03-29 LAB — COMPREHENSIVE METABOLIC PANEL WITH GFR
ALT: 25 U/L (ref 0–53)
AST: 17 U/L (ref 0–37)
Albumin: 4.3 g/dL (ref 3.5–5.2)
Alkaline Phosphatase: 77 U/L (ref 39–117)
BUN: 17 mg/dL (ref 6–23)
CO2: 30 meq/L (ref 19–32)
Calcium: 9.2 mg/dL (ref 8.4–10.5)
Chloride: 101 meq/L (ref 96–112)
Creatinine, Ser: 1.19 mg/dL (ref 0.40–1.50)
GFR: 63.63 mL/min (ref >60.00)
Glucose, Bld: 103 mg/dL — ABNORMAL HIGH (ref 70–99)
Potassium: 4.3 meq/L (ref 3.5–5.1)
Sodium: 138 meq/L (ref 135–145)
Total Bilirubin: 0.5 mg/dL (ref 0.2–1.2)
Total Protein: 6.7 g/dL (ref 6.0–8.3)

## 2024-03-29 LAB — HEMOGLOBIN A1C: Hgb A1c MFr Bld: 6.4 % (ref 4.6–6.5)

## 2024-03-29 LAB — CBC
HCT: 46.2 % (ref 39.0–52.0)
Hemoglobin: 15.4 g/dL (ref 13.0–17.0)
MCHC: 33.3 g/dL (ref 30.0–36.0)
MCV: 86.7 fl (ref 78.0–100.0)
Platelets: 228 K/uL (ref 150.0–400.0)
RBC: 5.33 Mil/uL (ref 4.22–5.81)
RDW: 13.6 % (ref 11.5–15.5)
WBC: 7.6 K/uL (ref 4.0–10.5)

## 2024-03-29 LAB — TSH: TSH: 1.06 u[IU]/mL (ref 0.35–5.50)

## 2024-03-29 MED ORDER — SERTRALINE HCL 50 MG PO TABS: 50.0000 mg | ORAL_TABLET | Freq: Every day | ORAL | 0 refills | Status: DC

## 2024-03-29 NOTE — Assessment & Plan Note (Signed)
 Otherwise stable. He has not noted major differences since he started sertraline  25 mg daily, he agrees with increasing dose from 25 mg daily to 50 mg daily. Recommend CBT, he can talk with through the [facility], he will try to arrange appointment today. Follow-up in 3 months, before if needed.

## 2024-03-29 NOTE — Assessment & Plan Note (Signed)
 SBP mildly elevated. Recommend increasing amlodipine  dose from 2.5 to 5 mg daily. Continue lisinopril  20 mg daily as well as low-salt diet. Monitor BP at home, goal SBP under 130. Follow-up in 3 months.

## 2024-03-29 NOTE — Assessment & Plan Note (Signed)
 Encourage consistency with a healthy lifestyle for diabetes prevention. Last hemoglobin A1c was 6.2 in 09/2023. Further recommendation will be given according to hemoglobin A1c result.

## 2024-03-29 NOTE — Patient Instructions (Addendum)
 A few things to remember from today's visit:  Essential hypertension - Plan: CBC, Comprehensive metabolic panel with GFR, TSH  Anxiety disorder, unspecified type - Plan: sertraline  (ZOLOFT ) 50 MG tablet  Weight loss, non-intentional - Plan: CBC, Comprehensive metabolic panel with GFR, TSH  Prediabetes - Plan: Hemoglobin A1c Sertraline  increased from 25 mg to 50 mg daily. Amlodipine  increased from 2.5 mg (1/2 tab) to the whole tab (5 mg). Blood pressure goal is under 130/80. Arrange appt with psychotherapist through the [facility].  If you need refills for medications you take chronically, please call your pharmacy. Do not use My Chart to request refills or for acute issues that need immediate attention. If you send a my chart message, it may take a few days to be addressed, specially if I am not in the office.  Please be sure medication list is accurate. If a new problem present, please set up appointment sooner than planned today.

## 2024-03-29 NOTE — Progress Notes (Signed)
 HPI: Steven Cook is a 68 y.o. male with a PMHx significant for HTN, HLD, prediabetes, GERD, BPH, and hearing loss, who is here today for chronic disease management.  Last seen on 02/15/24  Hypertension:  Medications: Amlodipine  5 mg daily and lisinopril  20 mg once daily. He has been taking a 1/2 tab of the Amlodipine .  At home BP's similar to reading today. Side effects: None.  Not checking BP at home. Negative for unusual or severe headache, visual changes, exertional chest pain, dyspnea,  focal weakness, or edema.  Lab Results  Component Value Date   CREATININE 1.06 09/21/2023   BUN 17 09/21/2023   NA 139 09/21/2023   K 4.2 09/21/2023   CL 103 09/21/2023   CO2 29 09/21/2023   Anxiety: Currently on Sertraline  25 mg once daily with good tolerance.  He has not noticed any improvements since starting Sertraline .  He continues to experience anxiety, especially while at work.  Reports noticing increased anxiety when he is not fully informed about things.  Sleeping about 7 hours on avg and generally feels well rested.   Unintentional Weight loss: Pt expressed concerns about his weight loss.  As we discussed, pt realized he may be able to attribute this weight loss to increased activity.  He is constantly active while at work, often walking around the large building and constantly walking up several flights up stairs.  Has been eating a high fiber diet.  No recent change in dietary habits.  Denies any chest pain, dypnea, wheezing, palpitations, blood/tarry stools, change in bowel habits.  Wt Readings from Last 3 Encounters:  03/29/24 172 lb (78 kg)  02/15/24 177 lb (80.3 kg)  02/02/24 180 lb (81.6 kg)   Lab Results  Component Value Date   HGBA1C 6.1 09/21/2023   Review of Systems  Constitutional:  Negative for fatigue.  HENT:  Negative for sore throat and trouble swallowing.   Endocrine: Negative for cold intolerance and heat intolerance.  Genitourinary:   Negative for decreased urine volume, dysuria and hematuria.  Skin:  Negative for rash.  Neurological:  Negative for syncope and facial asymmetry.  Psychiatric/Behavioral:  Negative for confusion and hallucinations.   See other pertinent positives and negatives in HPI.  Current Outpatient Medications on File Prior to Visit  Medication Sig Dispense Refill   amLODipine  (NORVASC ) 5 MG tablet TAKE 1 TABLET (5 MG TOTAL) BY MOUTH DAILY. 90 tablet 3   aspirin EC 81 MG tablet Take 81 mg by mouth daily. Swallow whole.     atorvastatin  (LIPITOR) 40 MG tablet TAKE 1 TABLET BY MOUTH EVERY DAY 90 tablet 3   clotrimazole -betamethasone  (LOTRISONE ) cream Apply 1 Application topically 2 (two) times daily as needed. 30 g 2   fluticasone  (FLONASE ) 50 MCG/ACT nasal spray Place 1-2 sprays into both nostrils at bedtime as needed for allergies or rhinitis. 16 g 3   lisinopril  (ZESTRIL ) 20 MG tablet TAKE 1 TABLET BY MOUTH EVERY DAY 90 tablet 2   omeprazole  (PRILOSEC) 20 MG capsule TAKE 1 CAPSULE BY MOUTH EVERY DAY 90 capsule 3   OVER THE COUNTER MEDICATION Pt states he is taking allergy med every day. Unable to recall name.     No current facility-administered medications on file prior to visit.   Past Medical History:  Diagnosis Date   Allergy    Arthritis 2020   GERD (gastroesophageal reflux disease)    Hyperlipemia    Hypertension    Pneumonia    Allergies  Allergen  Reactions   Nsaids Anaphylaxis   Social History   Socioeconomic History   Marital status: Married    Spouse name: Not on file   Number of children: Not on file   Years of education: Not on file   Highest education level: Associate degree: occupational, Scientist, product/process development, or vocational program  Occupational History   Not on file  Tobacco Use   Smoking status: Former    Current packs/day: 0.00    Average packs/day: 0.7 packs/day for 47.5 years (35.7 ttl pk-yrs)    Types: Cigarettes    Start date: 09/04/1973    Quit date: 03/18/2021    Years  since quitting: 3.0   Smokeless tobacco: Current   Tobacco comments:    40 years on and off.    For several years he smoked 1 pack per week.  Vaping Use   Vaping status: Some Days   Substances: Nicotine   Substance and Sexual Activity   Alcohol use: Yes    Comment: rare   Drug use: No   Sexual activity: Not on file  Other Topics Concern   Not on file  Social History Narrative   Not on file   Social Drivers of Health   Financial Resource Strain: Low Risk  (03/28/2024)   Overall Financial Resource Strain (CARDIA)    Difficulty of Paying Living Expenses: Not hard at all  Food Insecurity: No Food Insecurity (03/28/2024)   Hunger Vital Sign    Worried About Running Out of Food in the Last Year: Never true    Ran Out of Food in the Last Year: Never true  Transportation Needs: No Transportation Needs (03/28/2024)   PRAPARE - Administrator, Civil Service (Medical): No    Lack of Transportation (Non-Medical): No  Physical Activity: Sufficiently Active (03/28/2024)   Exercise Vital Sign    Days of Exercise per Week: 3 days    Minutes of Exercise per Session: 150+ min  Stress: Stress Concern Present (03/28/2024)   [employer] of Occupational Health - Occupational Stress Questionnaire    Feeling of Stress: To some extent  Social Connections: Socially Isolated (03/28/2024)   Social Connection and Isolation Panel    Frequency of Communication with Friends and Family: Once a week    Frequency of Social Gatherings with Friends and Family: Once a week    Attends Religious Services: Never    Database administrator or Organizations: No    Attends Engineer, structural: Not on file    Marital Status: Married   Vitals:   03/29/24 0703  BP: 138/82  Pulse: 65  Resp: 16  Temp: 98.3 F (36.8 C)  SpO2: 97%   Body mass index is 24.68 kg/m.  Physical Exam Vitals and nursing note reviewed.  Constitutional:      General: He is not in acute distress.    Appearance:  He is well-developed.  HENT:     Head: Normocephalic and atraumatic.     Mouth/Throat:     Mouth: Mucous membranes are moist.     Pharynx: Oropharynx is clear.  Eyes:     Conjunctiva/sclera: Conjunctivae normal.  Cardiovascular:     Rate and Rhythm: Normal rate and regular rhythm.     Pulses:          Dorsalis pedis pulses are 2+ on the right side and 2+ on the left side.     Heart sounds: No murmur heard. Pulmonary:     Effort: Pulmonary effort is normal.  No respiratory distress.     Breath sounds: Normal breath sounds.  Abdominal:     Palpations: Abdomen is soft. There is no mass.     Tenderness: There is no abdominal tenderness.  Lymphadenopathy:     Cervical: No cervical adenopathy.  Skin:    General: Skin is warm.     Findings: No erythema or rash.  Neurological:     Mental Status: He is alert and oriented to person, place, and time.     Cranial Nerves: No cranial nerve deficit.     Gait: Gait normal.  Psychiatric:        Mood and Affect: Affect normal. Mood is anxious.        Thought Content: Thought content does not include suicidal ideation. Thought content does not include suicidal plan.   ASSESSMENT AND PLAN: Mr. Filimon Miranda was seen today for chronic disease management.  Orders Placed This Encounter  Procedures   CBC   Comprehensive metabolic panel with GFR   TSH   Hemoglobin A1c   Lab Results  Component Value Date   HGBA1C 6.4 03/29/2024   Lab Results  Component Value Date   NA 138 03/29/2024   CL 101 03/29/2024   K 4.3 03/29/2024   CO2 30 03/29/2024   BUN 17 03/29/2024   CREATININE 1.19 03/29/2024   GFR 63.63 03/29/2024   CALCIUM  9.2 03/29/2024   ALBUMIN 4.3 03/29/2024   GLUCOSE 103 (H) 03/29/2024   Lab Results  Component Value Date   ALT 25 03/29/2024   AST 17 03/29/2024   ALKPHOS 77 03/29/2024   BILITOT 0.5 03/29/2024   Lab Results  Component Value Date   WBC 7.6 03/29/2024   HGB 15.4 03/29/2024   HCT 46.2 03/29/2024   MCV  86.7 03/29/2024   PLT 228.0 03/29/2024   Lab Results  Component Value Date   TSH 1.06 03/29/2024   Weight loss, non-intentional He is concerned because he has lost about 5-6 pounds since his last visit, 02/15/2024. Most likely due to dietary changes (increasing fiber and decreased in bread intake) as well as increasing physical activity at work. Cancer screening recommended for his age he is up today. We will continue monitoring weight. CMP, TSH, hemoglobin A1c, CBC ordered today. -     CBC; Future -     Comprehensive metabolic panel with GFR; Future -     TSH; Future  Prediabetes Assessment & Plan: Encourage consistency with a healthy lifestyle for diabetes prevention. Last hemoglobin A1c was 6.2 in 09/2023. Further recommendation will be given according to hemoglobin A1c result.  Orders: -     Hemoglobin A1c; Future  Anxiety disorder, unspecified type Assessment & Plan: Otherwise stable. He has not noted major differences since he started sertraline  25 mg daily, he agrees with increasing dose from 25 mg daily to 50 mg daily. Recommend CBT, he can talk with through the [facility], he will try to arrange appointment today. Follow-up in 3 months, before if needed.  Orders: -     Sertraline  HCl; Take 1 tablet (50 mg total) by mouth daily.  Dispense: 30 tablet; Refill: 0  Essential hypertension Assessment & Plan: SBP mildly elevated. Recommend increasing amlodipine  dose from 2.5 to 5 mg daily. Continue lisinopril  20 mg daily as well as low-salt diet. Monitor BP at home, goal SBP under 130. Follow-up in 3 months.  Orders: -     CBC; Future -     Comprehensive metabolic panel with GFR; Future -  TSH; Future  Need for Tdap vaccination -     Tdap vaccine greater than or equal to 7yo IM  Return in about 3 months (around 06/29/2024) for chronic problems: HTN, anxiety,and wt.  I, Vernell Forest, acting as a scribe for Maley Venezia [place], MD., have documented all relevant documentation on  the behalf of Ifrah Vest [place], MD, as directed by   while in the presence of Channelle Bottger [place], MD.  I, Sahira Cataldi [place], MD, have reviewed all documentation for this visit. The documentation on 03/29/24 for the exam, diagnosis, procedures, and orders are all accurate and complete.  Cherita Hebel G. [place], MD  [facility]

## 2024-04-27 ENCOUNTER — Other Ambulatory Visit: Payer: Self-pay | Admitting: Family Medicine

## 2024-04-27 DIAGNOSIS — F419 Anxiety disorder, unspecified: Secondary | ICD-10-CM

## 2024-05-13 ENCOUNTER — Other Ambulatory Visit: Payer: Self-pay | Admitting: Family Medicine

## 2024-05-13 DIAGNOSIS — R0981 Nasal congestion: Secondary | ICD-10-CM

## 2024-05-28 ENCOUNTER — Other Ambulatory Visit: Payer: Self-pay | Admitting: Family Medicine

## 2024-05-28 DIAGNOSIS — F419 Anxiety disorder, unspecified: Secondary | ICD-10-CM

## 2024-06-07 DIAGNOSIS — M25522 Pain in left elbow: Secondary | ICD-10-CM | POA: Diagnosis not present

## 2024-06-15 ENCOUNTER — Other Ambulatory Visit: Payer: Self-pay | Admitting: Family Medicine

## 2024-06-15 DIAGNOSIS — I10 Essential (primary) hypertension: Secondary | ICD-10-CM

## 2024-07-03 ENCOUNTER — Other Ambulatory Visit: Payer: Self-pay | Admitting: Family Medicine

## 2024-07-03 DIAGNOSIS — F419 Anxiety disorder, unspecified: Secondary | ICD-10-CM

## 2024-07-17 ENCOUNTER — Other Ambulatory Visit: Payer: Self-pay | Admitting: Family Medicine

## 2024-08-03 ENCOUNTER — Other Ambulatory Visit: Payer: Self-pay | Admitting: Family Medicine

## 2024-08-03 DIAGNOSIS — F419 Anxiety disorder, unspecified: Secondary | ICD-10-CM

## 2024-08-11 ENCOUNTER — Other Ambulatory Visit: Payer: Self-pay | Admitting: Family Medicine

## 2024-08-11 DIAGNOSIS — K219 Gastro-esophageal reflux disease without esophagitis: Secondary | ICD-10-CM

## 2024-09-04 ENCOUNTER — Other Ambulatory Visit: Payer: Self-pay | Admitting: Family Medicine

## 2024-09-04 DIAGNOSIS — F419 Anxiety disorder, unspecified: Secondary | ICD-10-CM

## 2024-10-04 ENCOUNTER — Ambulatory Visit: Admitting: Family Medicine

## 2024-10-05 ENCOUNTER — Encounter: Admitting: Family Medicine
